# Patient Record
Sex: Female | Born: 1990 | Race: Black or African American | Hispanic: No | Marital: Single | Smoking: Never smoker
Health system: Southern US, Community
[De-identification: ages and names within clinical notes are randomized; demographics above are authoritative.]

## PROBLEM LIST (undated history)

## (undated) DIAGNOSIS — M940 Chondrocostal junction syndrome [Tietze]: Secondary | ICD-10-CM

## (undated) DIAGNOSIS — K589 Irritable bowel syndrome without diarrhea: Secondary | ICD-10-CM

## (undated) DIAGNOSIS — K219 Gastro-esophageal reflux disease without esophagitis: Secondary | ICD-10-CM

## (undated) HISTORY — PX: BREAST SURGERY: SHX581

## (undated) HISTORY — DX: Irritable bowel syndrome, unspecified: K58.9

## (undated) HISTORY — DX: Chondrocostal junction syndrome (tietze): M94.0

## (undated) HISTORY — DX: Gastro-esophageal reflux disease without esophagitis: K21.9

---

## 2017-03-01 ENCOUNTER — Other Ambulatory Visit: Payer: Self-pay

## 2017-03-01 ENCOUNTER — Encounter (INDEPENDENT_AMBULATORY_CARE_PROVIDER_SITE_OTHER): Payer: Self-pay | Admitting: Physician Assistant

## 2017-03-01 ENCOUNTER — Ambulatory Visit (INDEPENDENT_AMBULATORY_CARE_PROVIDER_SITE_OTHER): Payer: Self-pay | Admitting: Physician Assistant

## 2017-03-01 ENCOUNTER — Ambulatory Visit: Payer: Medicaid Other | Attending: Internal Medicine

## 2017-03-01 VITALS — BP 147/91 | HR 95 | Temp 99.3°F | Ht 67.5 in | Wt 154.0 lb

## 2017-03-01 DIAGNOSIS — R079 Chest pain, unspecified: Secondary | ICD-10-CM

## 2017-03-01 DIAGNOSIS — R002 Palpitations: Secondary | ICD-10-CM

## 2017-03-01 DIAGNOSIS — R110 Nausea: Secondary | ICD-10-CM

## 2017-03-01 MED ORDER — PROPRANOLOL HCL 20 MG PO TABS: 20.0000 mg | ORAL_TABLET | Freq: Two times a day (BID) | ORAL | 1 refills | Status: DC

## 2017-03-01 MED ORDER — ONDANSETRON HCL 4 MG PO TABS: 4.0000 mg | ORAL_TABLET | Freq: Three times a day (TID) | ORAL | 0 refills | Status: DC | PRN

## 2017-03-01 NOTE — Progress Notes (Signed)
Subjective:  Patient ID: Janice [name], female    DOB: [birth year]  Age: 25 y.o. MRN: [id number]  CC: palpitations   HPI Janice [name] is a 26 y.o. female with no significant PMH presents with chest wall pain from lower sternum to left and right upper chest since 3 days ago. Characterized as dull/annoying. Pain level 2/10.Palpitations are also present since 3 days ago. Palpitations occur moreso during the day time. Does not feel on edge or nervous. Has also felt restless lately but has not identified any emotional/stressful triggers. Considers herslef a "worrier".  Has not  been sexually active x 1 year and takes birth control. Denies any other symptoms.    ROS Review of Systems  Constitutional: Negative for chills, fever and malaise/fatigue.  Eyes: Negative for blurred vision.  Respiratory: Negative for shortness of breath.   Cardiovascular: Positive for chest pain and palpitations.  Gastrointestinal: Negative for abdominal pain and nausea.  Genitourinary: Negative for dysuria and hematuria.  Musculoskeletal: Negative for joint pain and myalgias.  Skin: Negative for rash.  Neurological: Negative for tingling and headaches.  Psychiatric/Behavioral: Negative for depression. The patient is not nervous/anxious.     Objective:  BP (!) 147/91 (BP Location: Right Arm, Patient Position: Sitting, Cuff Size: Normal)   Pulse 95   Temp 99.3 F (37.4 C) (Oral)   Ht 5' 7.5" (1.715 m)   Wt 154 lb (69.9 kg)   LMP 02/07/2017 (Approximate)   SpO2 100%   BMI 23.76 kg/m   BP/Weight 9/32/6712  Systolic BP 458  Diastolic BP 91  Wt. (Lbs) 154  BMI 23.76      Physical Exam  Constitutional: She is oriented to person, place, and time.  Well developed, well nourished, NAD, polite  HENT:  Head: Normocephalic and atraumatic.  Eyes: No scleral icterus.  Neck: Normal range of motion. Neck supple. No thyromegaly present.  Cardiovascular: Normal rate, regular rhythm and normal heart sounds.    Pulmonary/Chest: Effort normal and breath sounds normal.  Musculoskeletal: She exhibits no edema.  Neurological: She is alert and oriented to person, place, and time.  Skin: Skin is warm and dry. No rash noted. No erythema. No pallor.  Psychiatric: She has a normal mood and affect. Her behavior is normal. Thought content normal.  Vitals reviewed.    Assessment & Plan:   1. Palpitations - TSH - CBC with Differential/Platelet - Comprehensive metabolic panel - propranolol (INDERAL) 20 MG tablet; Take 1 tablet (20 mg total) by mouth 2 (two) times daily.  Dispense: 60 tablet; Refill: 1  2. Chest pain, unspecified type - EKG 12-Lead not working here in clinic. Had to send to [facility].   3. Nausea without vomiting - ondansetron (ZOFRAN) 4 MG tablet; Take 1 tablet (4 mg total) by mouth every 8 (eight) hours as needed for nausea or vomiting.  Dispense: 20 tablet; Refill: 0   Meds ordered this encounter  Medications  . ondansetron (ZOFRAN) 4 MG tablet    Sig: Take 1 tablet (4 mg total) by mouth every 8 (eight) hours as needed for nausea or vomiting.    Dispense:  20 tablet    Refill:  0    Order Specific Question:   Supervising Provider    Answer:   Tresa Garter [id number]  . propranolol (INDERAL) 20 MG tablet    Sig: Take 1 tablet (20 mg total) by mouth 2 (two) times daily.    Dispense:  60 tablet    Refill:  1  Order Specific Question:   Supervising Provider    Answer:   Tresa Garter [[id number]]    Follow-up: Return for palpitations.   Clent Demark PA

## 2017-03-01 NOTE — Patient Instructions (Signed)
Please go to [facility] to have your EKG done. I will call with results as soon as it is available. Take your medications as prescribed. Call an ambulance if your chest symptoms are persistent or worse.   Palpitations A palpitation is the feeling that your heartbeat is irregular or is faster than normal. It may feel like your heart is fluttering or skipping a beat. Palpitations are usually not a serious problem. They may be caused by many things, including smoking, caffeine, alcohol, stress, and certain medicines. Although most causes of palpitations are not serious, palpitations can be a sign of a serious medical problem. In some cases, you may need further medical evaluation. Follow these instructions at home: Pay attention to any changes in your symptoms. Take these actions to help with your condition:  Avoid the following:  Caffeinated coffee, tea, soft drinks, diet pills, and energy drinks.  Chocolate.  Alcohol.  Do not use any tobacco products, such as cigarettes, chewing tobacco, and e-cigarettes. If you need help quitting, ask your health care provider.  Try to reduce your stress and anxiety. Things that can help you relax include:  Yoga.  Meditation.  Physical activity, such as swimming, jogging, or walking.  Biofeedback. This is a method that helps you learn to use your mind to control things in your body, such as your heartbeats.  Get plenty of rest and sleep.  Take over-the-counter and prescription medicines only as told by your health care provider.  Keep all follow-up visits as told by your health care provider. This is important. Contact a health care provider if:  You continue to have a fast or irregular heartbeat after 24 hours.  Your palpitations occur more often. Get help right away if:  You have chest pain or shortness of breath.  You have a severe headache.  You feel dizzy or you faint. This information is not  intended to replace advice given to you by your health care provider. Make sure you discuss any questions you have with your health care provider. Document Released: 12/01/2000 Document Revised: 05/08/2016 Document Reviewed: 08/19/2015 Elsevier Interactive Patient Education  2017 [employer].

## 2017-03-02 LAB — COMPREHENSIVE METABOLIC PANEL
A/G RATIO: 1.5 (ref 1.2–2.2)
ALK PHOS: 58 IU/L (ref 39–117)
ALT: 17 IU/L (ref 0–32)
AST: 18 IU/L (ref 0–40)
Albumin: 4.7 g/dL (ref 3.5–5.5)
BUN/Creatinine Ratio: 11 (ref 9–23)
BUN: 10 mg/dL (ref 6–20)
Bilirubin Total: 0.4 mg/dL (ref 0.0–1.2)
CO2: 22 mmol/L (ref 18–29)
CREATININE: 0.92 mg/dL (ref 0.57–1.00)
Calcium: 9.5 mg/dL (ref 8.7–10.2)
Chloride: 98 mmol/L (ref 96–106)
GFR calc Af Amer: 99 mL/min/{1.73_m2} (ref >59)
GFR calc non Af Amer: 86 mL/min/{1.73_m2} (ref >59)
GLOBULIN, TOTAL: 3.2 g/dL (ref 1.5–4.5)
Glucose: 92 mg/dL (ref 65–99)
POTASSIUM: 4.1 mmol/L (ref 3.5–5.2)
SODIUM: 139 mmol/L (ref 134–144)
Total Protein: 7.9 g/dL (ref 6.0–8.5)

## 2017-03-02 LAB — CBC WITH DIFFERENTIAL/PLATELET
Basophils Absolute: 0 10*3/uL (ref 0.0–0.2)
Basos: 0 %
EOS (ABSOLUTE): 0 10*3/uL (ref 0.0–0.4)
EOS: 0 %
HEMATOCRIT: 42.3 % (ref 34.0–46.6)
Hemoglobin: 14.1 g/dL (ref 11.1–15.9)
Immature Grans (Abs): 0 10*3/uL (ref 0.0–0.1)
Immature Granulocytes: 0 %
LYMPHS ABS: 2.8 10*3/uL (ref 0.7–3.1)
Lymphs: 34 %
MCH: 30.3 pg (ref 26.6–33.0)
MCHC: 33.3 g/dL (ref 31.5–35.7)
MCV: 91 fL (ref 79–97)
MONOS ABS: 0.5 10*3/uL (ref 0.1–0.9)
Monocytes: 6 %
Neutrophils Absolute: 4.9 10*3/uL (ref 1.4–7.0)
Neutrophils: 60 %
Platelets: 203 10*3/uL (ref 150–379)
RBC: 4.65 x10E6/uL (ref 3.77–5.28)
RDW: 13.7 % (ref 12.3–15.4)
WBC: 8.3 10*3/uL (ref 3.4–10.8)

## 2017-03-02 LAB — TSH: TSH: 1.65 u[IU]/mL (ref 0.450–4.500)

## 2017-03-15 ENCOUNTER — Encounter (INDEPENDENT_AMBULATORY_CARE_PROVIDER_SITE_OTHER): Payer: Self-pay | Admitting: Physician Assistant

## 2017-03-15 ENCOUNTER — Ambulatory Visit (INDEPENDENT_AMBULATORY_CARE_PROVIDER_SITE_OTHER): Payer: Self-pay | Admitting: Physician Assistant

## 2017-03-15 VITALS — BP 130/90 | HR 79 | Temp 99.0°F | Ht 67.5 in | Wt 155.8 lb

## 2017-03-15 DIAGNOSIS — R002 Palpitations: Secondary | ICD-10-CM

## 2017-03-15 MED ORDER — PROPRANOLOL HCL 20 MG PO TABS: 20.0000 mg | ORAL_TABLET | Freq: Every day | ORAL | 1 refills | Status: DC

## 2017-03-15 NOTE — Patient Instructions (Signed)
Palpitations A palpitation is the feeling that your heartbeat is irregular or is faster than normal. It may feel like your heart is fluttering or skipping a beat. Palpitations are usually not a serious problem. They may be caused by many things, including smoking, caffeine, alcohol, stress, and certain medicines. Although most causes of palpitations are not serious, palpitations can be a sign of a serious medical problem. In some cases, you may need further medical evaluation. Follow these instructions at home: Pay attention to any changes in your symptoms. Take these actions to help with your condition:  Avoid the following: ? Caffeinated coffee, tea, soft drinks, diet pills, and energy drinks. ? Chocolate. ? Alcohol.  Do not use any tobacco products, such as cigarettes, chewing tobacco, and e-cigarettes. If you need help quitting, ask your health care provider.  Try to reduce your stress and anxiety. Things that can help you relax include: ? Yoga. ? Meditation. ? Physical activity, such as swimming, jogging, or walking. ? Biofeedback. This is a method that helps you learn to use your mind to control things in your body, such as your heartbeats.  Get plenty of rest and sleep.  Take over-the-counter and prescription medicines only as told by your health care provider.  Keep all follow-up visits as told by your health care provider. This is important.  Contact a health care provider if:  You continue to have a fast or irregular heartbeat after 24 hours.  Your palpitations occur more often. Get help right away if:  You have chest pain or shortness of breath.  You have a severe headache.  You feel dizzy or you faint. This information is not intended to replace advice given to you by your health care provider. Make sure you discuss any questions you have with your health care provider. Document Released: 12/01/2000 Document Revised: 05/08/2016 Document Reviewed: 08/19/2015 Elsevier  Interactive Patient Education  2017 [employer]  

## 2017-03-15 NOTE — Progress Notes (Signed)
  Subjective:  Patient ID: Janice [name], female    DOB: Dec 13, 1991  Age: 26 y.o. MRN: [id number]  CC: palpitations  HPI Janice [name] is a 26 y.o. female with no significant PMH presents for f/u of palpitations. Has been on Propranolol 20mg  BID since her last visit two weeks ago. Has not had any palpitations since then. Says that she is trying to ween herself off propranolol and is now taking it once per day with no palpitations present. She feels well and does not have any other complaints.     Review of Systems  Constitutional: Negative for chills, fever and malaise/fatigue.  Eyes: Negative for blurred vision.  Respiratory: Negative for shortness of breath.   Cardiovascular: Negative for chest pain and palpitations.  Gastrointestinal: Negative for abdominal pain and nausea.  Genitourinary: Negative for dysuria and hematuria.  Musculoskeletal: Negative for joint pain and myalgias.  Skin: Negative for rash.  Neurological: Negative for tingling and headaches.  Psychiatric/Behavioral: Negative for depression. The patient is not nervous/anxious.     Objective:  BP 130/90 (BP Location: Right Arm, Patient Position: Sitting, Cuff Size: Normal)   Pulse 79   Temp 99 F (37.2 C) (Oral)   Ht 5' 7.5" (1.715 m)   Wt 155 lb 12.8 oz (70.7 kg)   LMP 03/06/2017 (Exact Date)   SpO2 100%   BMI 24.04 kg/m   BP/Weight 03/15/2017 8/92/1194  Systolic BP 174 081  Diastolic BP 90 91  Wt. (Lbs) 155.8 154  BMI 24.04 23.76      Physical Exam  Constitutional: She is oriented to person, place, and time.  Well developed, well nourished, NAD, polite  HENT:  Head: Normocephalic and atraumatic.  Eyes: No scleral icterus.  Neck: Normal range of motion. No thyromegaly present.  Cardiovascular: Normal rate, regular rhythm and normal heart sounds.   Pulmonary/Chest: Effort normal and breath sounds normal.  Musculoskeletal: She exhibits no edema.  Neurological: She is alert and oriented to person, place, and  time. No cranial nerve deficit. Coordination normal.  Skin: Skin is warm and dry. No rash noted. She is not diaphoretic. No erythema. No pallor.  Psychiatric: She has a normal mood and affect. Her behavior is normal. Thought content normal.  Vitals reviewed.    Assessment & Plan:   1. Palpitations - Resolved with propranolol. Pt would like to ween off. I have advised patient to monitor symptoms and report to me if palpitations return. Advised to call ambulance if any severe symptoms should arise. - propranolol (INDERAL) 20 MG tablet; Take 1 tablet (20 mg total) by mouth daily.  Dispense: 60 tablet; Refill: 1   Meds ordered this encounter  Medications  . propranolol (INDERAL) 20 MG tablet    Sig: Take 1 tablet (20 mg total) by mouth daily.    Dispense:  60 tablet    Refill:  1    Order Specific Question:   Supervising Provider    Answer:   Tresa Garter [[id number]]    Follow-up: Return for annual physical.   Clent Demark PA

## 2017-04-05 ENCOUNTER — Telehealth (INDEPENDENT_AMBULATORY_CARE_PROVIDER_SITE_OTHER): Payer: Self-pay | Admitting: Physician Assistant

## 2017-04-05 NOTE — Telephone Encounter (Signed)
Patient called stated has question about her bill .  Informed patient to call patient billing :  Phone number on her bill. Patient agreed but still want to talk to PA regarding her follow up visit.  She stated she is off her mediation not having any issues. Stated does not need appt but if someone can call her back  [place]:   [phone]

## 2017-04-06 NOTE — Telephone Encounter (Signed)
Spoke with patient, she had questions about having her bill reduced, informed patient she would need to contact the billing department for all bill related inquires. Nat Christen, CMA

## 2020-01-20 ENCOUNTER — Emergency Department (HOSPITAL_COMMUNITY)
Admission: EM | Admit: 2020-01-20 | Discharge: 2020-01-20 | Disposition: A | Discharge status: Home / Self Care | Payer: Self-pay | Attending: Emergency Medicine | Admitting: Emergency Medicine

## 2020-01-20 ENCOUNTER — Other Ambulatory Visit: Payer: Self-pay

## 2020-01-20 ENCOUNTER — Encounter (HOSPITAL_COMMUNITY): Payer: Self-pay | Admitting: Emergency Medicine

## 2020-01-20 ENCOUNTER — Emergency Department (HOSPITAL_COMMUNITY): Payer: Self-pay

## 2020-01-20 DIAGNOSIS — R0789 Other chest pain: Secondary | ICD-10-CM | POA: Insufficient documentation

## 2020-01-20 DIAGNOSIS — Z79899 Other long term (current) drug therapy: Secondary | ICD-10-CM | POA: Insufficient documentation

## 2020-01-20 LAB — POC URINE PREG, ED: Preg Test, Ur: NEGATIVE

## 2020-01-20 IMAGING — CR DG RIBS W/ CHEST 3+V*R*
5 series · 5 of 5 positions shown · non-contrast
Comparison: None.

CLINICAL DATA: Right-sided rib pain for 2 weeks.

EXAM:
RIGHT RIBS AND CHEST - 3+ VIEW

[chest pa]
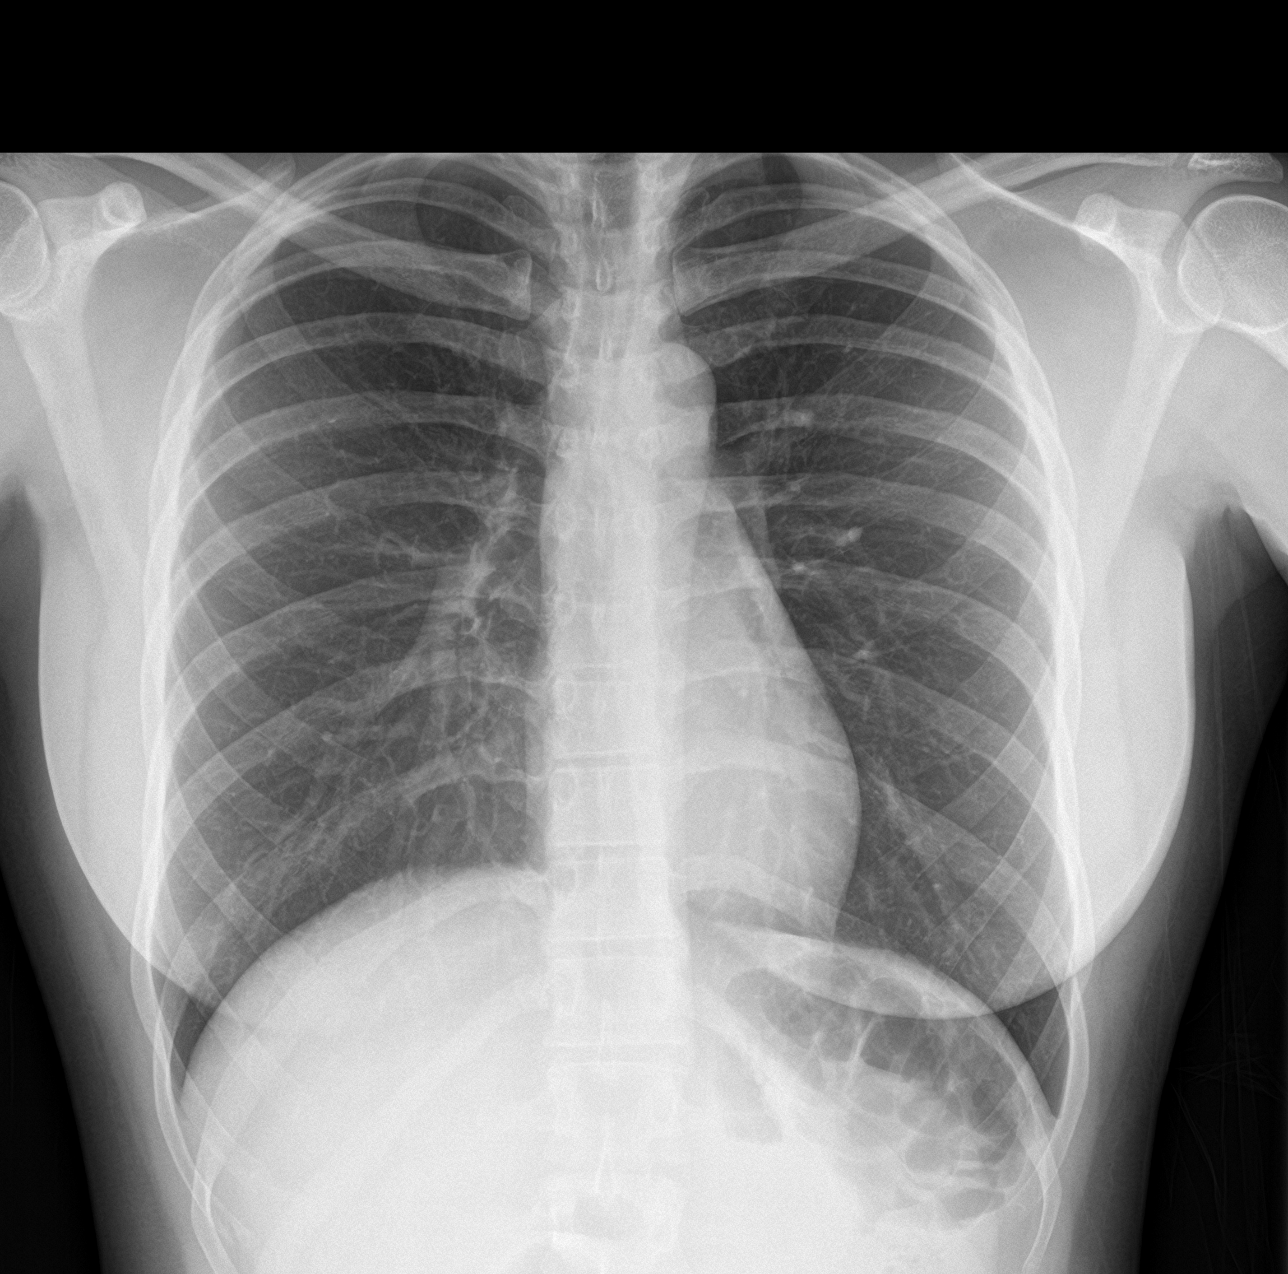

[rib pa obl (1 of 2)]
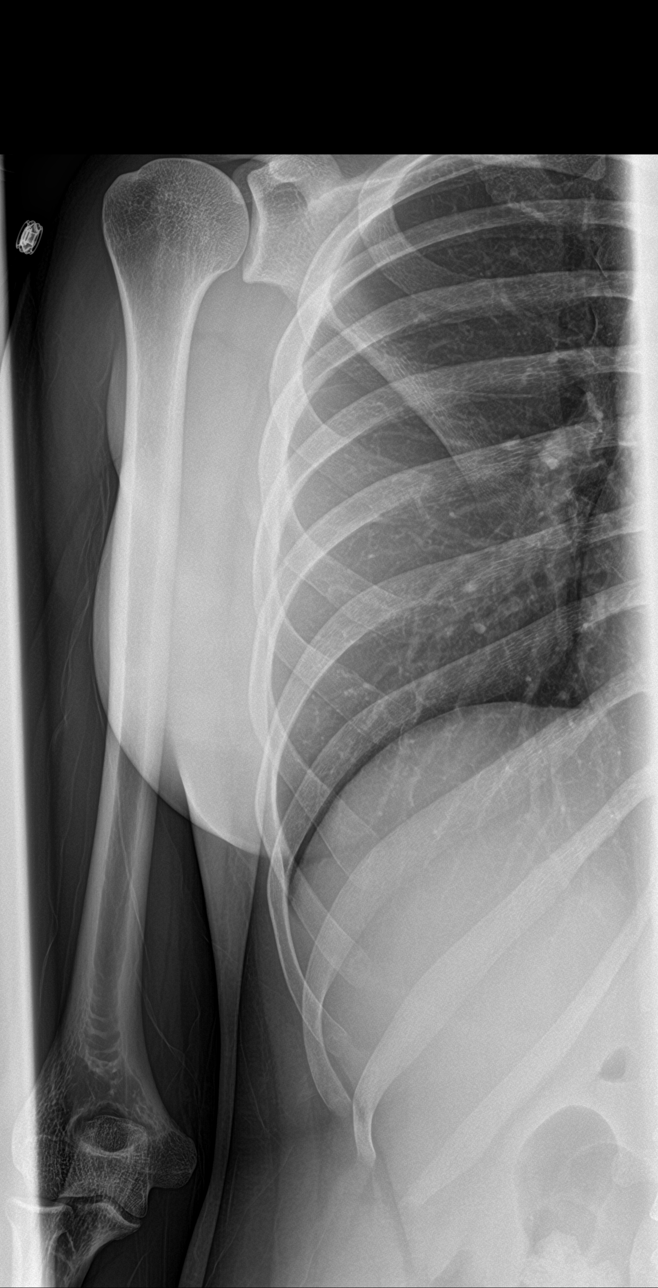

[rib pa obl (2 of 2)]
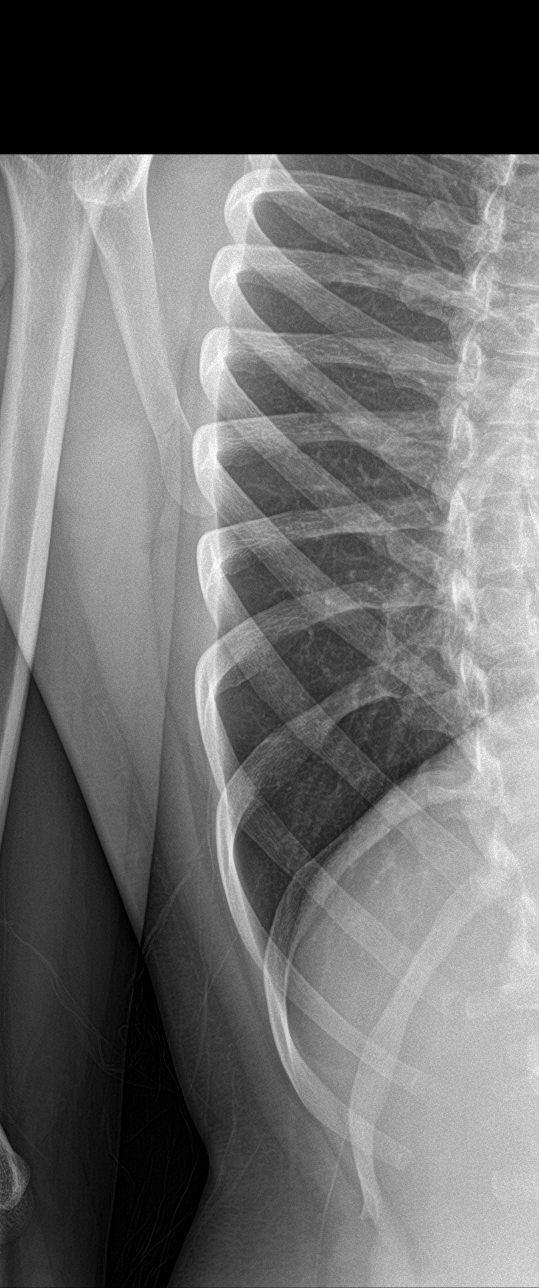

[chest ap]
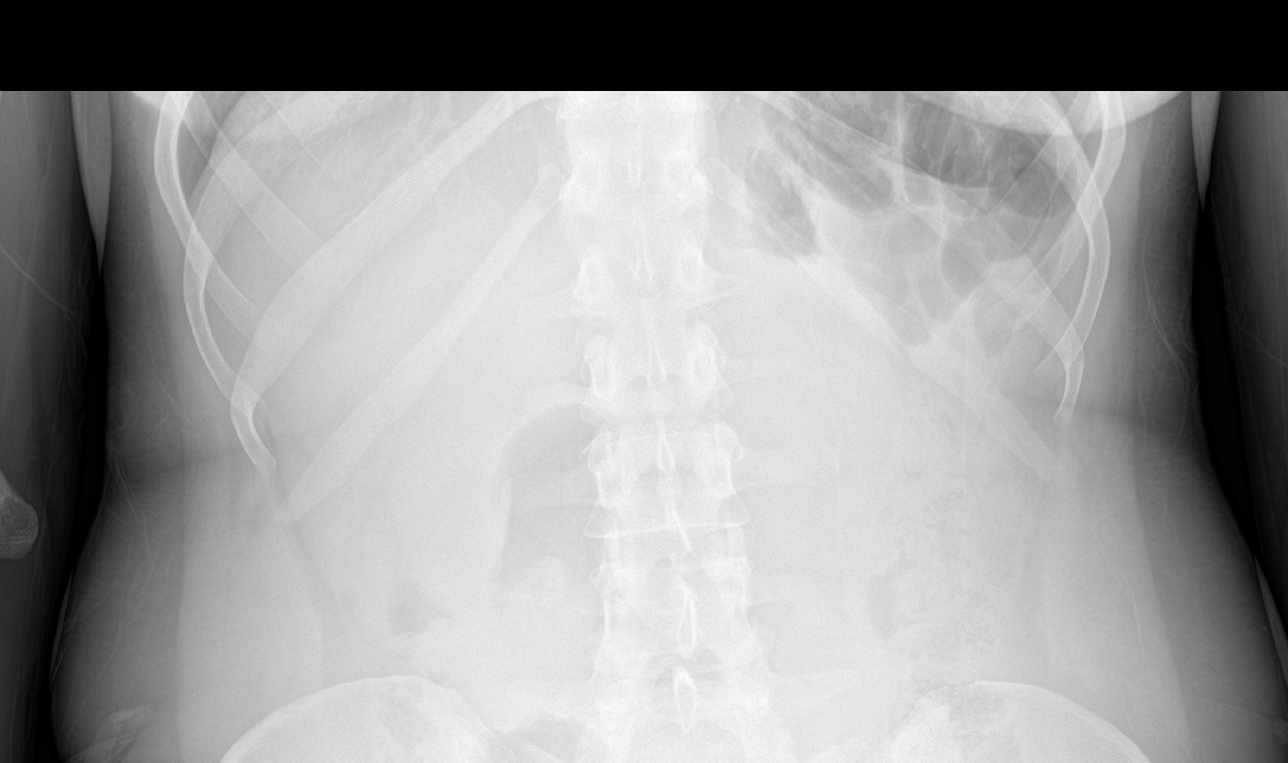

[rib ap]
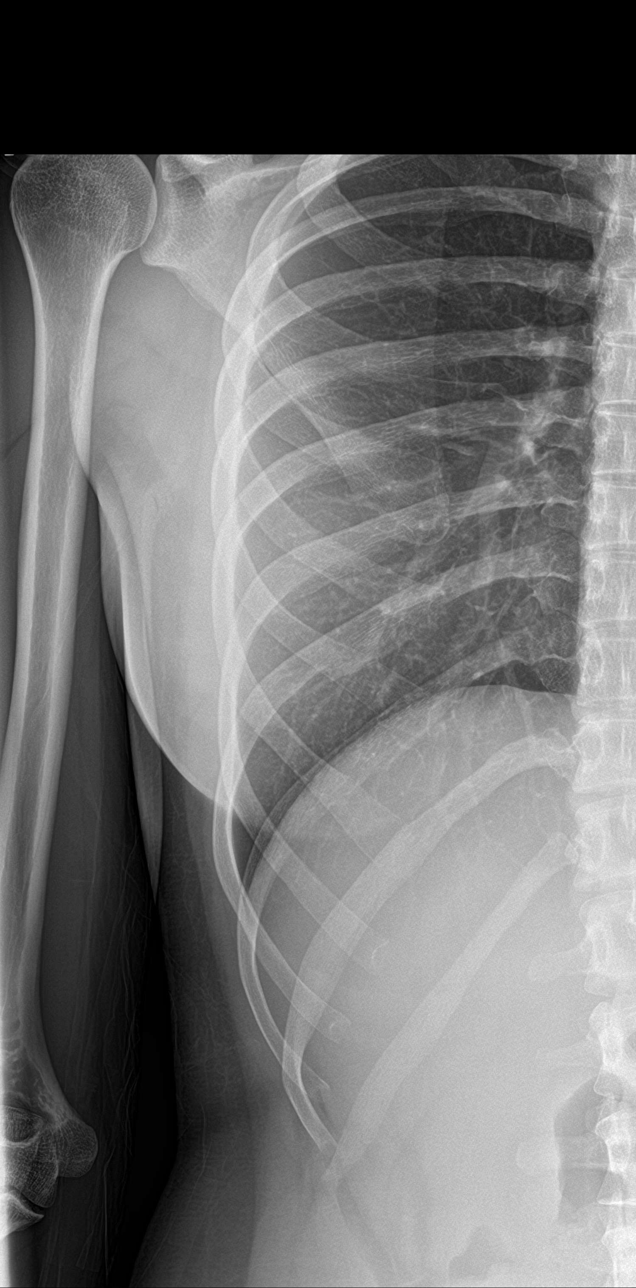

[5 of 5 positions shown; findings below may reference images not displayed]

FINDINGS: No fracture or other bone lesions are seen involving the ribs. There
is no evidence of pneumothorax or pleural effusion. Both lungs are
clear. Heart size and mediastinal contours are within normal limits.
IMPRESSION: Negative.

## 2020-01-20 MED ORDER — METHOCARBAMOL 500 MG PO TABS: 500.0000 mg | ORAL_TABLET | Freq: Two times a day (BID) | ORAL | 0 refills | Status: DC

## 2020-01-20 MED ORDER — DICLOFENAC SODIUM 1 % EX GEL
4.0000 g | Freq: Once | CUTANEOUS | Status: AC
  Administered 2020-01-20: 23:00:00 4 g via TOPICAL
  Filled 2020-01-20: qty 100

## 2020-01-20 MED ORDER — DICLOFENAC SODIUM 1 % EX GEL: 4.0000 g | Freq: Four times a day (QID) | CUTANEOUS | 2 refills | Status: DC

## 2020-01-20 MED ORDER — LIDOCAINE 5 % EX PTCH: 1.0000 | MEDICATED_PATCH | CUTANEOUS | 0 refills | Status: DC

## 2020-01-20 NOTE — ED Provider Notes (Signed)
[facility] Provider Note   CSN: [id number] Arrival date & time: 01/20/20  [id number]     History Chief Complaint  Patient presents with  . Breast Pain    Janice [name] is a 29 y.o. female.  HPI      Janice [name] is a 30 y.o. female, patient with no pertinent past medical history, presenting to the ED with right-sided chest tenderness beginning 2 weeks ago.  She states the pain arose gradually following an intense workout that included upper body weight lifting and full body exercises, such as jumping jacks.  Pain is described as a soreness, worse with movement or palpation, mild to moderate, nonradiating.  She has intermittently tried naproxen with temporary improvement.  Denies fever/chills, swelling, deformity, falls/trauma, breast swelling, nipple discharge, shortness of breath, cough, abdominal pain, N/V/D, dizziness, diaphoresis, or any other complaints.   History reviewed. No pertinent past medical history.  There are no problems to display for this patient.   History reviewed. No pertinent surgical history.   OB History   No obstetric history on file.     No family history on file.  Social History   Tobacco Use  . Smoking status: Never Smoker  . Smokeless tobacco: Never Used  Substance Use Topics  . Alcohol use: Never  . Drug use: Never    Home Medications Prior to Admission medications   Medication Sig Start Date End Date Taking? Authorizing Provider  cetirizine (ZYRTEC) 10 MG tablet Take 10 mg by mouth daily as needed (for seasonal allergies).    Yes [provider]  fluticasone (FLONASE) 50 MCG/ACT nasal spray Place 2 sprays into both nostrils daily as needed (for seasonal allergies).    Yes [provider]  Multiple Vitamins-Minerals (ONE-A-DAY WOMENS PO) Take 1 tablet by mouth daily with breakfast.    Yes [provider]  naproxen (NAPROSYN) 500 MG tablet Take 500 mg by mouth 2 (two) times daily  as needed for pain. 09/24/19  Yes [provider]  diclofenac Sodium (VOLTAREN) 1 % GEL Apply 4 g topically 4 (four) times daily. 01/20/20   Maddelynn Moosman C, PA-C  lidocaine (LIDODERM) 5 % Place 1 patch onto the skin daily. Remove & Discard patch within 12 hours or as directed by MD 01/20/20   Arlean Hopping C, PA-C  methocarbamol (ROBAXIN) 500 MG tablet Take 1 tablet (500 mg total) by mouth 2 (two) times daily. 01/20/20   Ulysses Alper, Helane Gunther, PA-C    Allergies    Patient has no known allergies.  Review of Systems   Review of Systems  Constitutional: Negative for chills, diaphoresis and fever.  Respiratory: Negative for cough and shortness of breath.   Cardiovascular: Negative for leg swelling.  Gastrointestinal: Negative for abdominal pain, diarrhea, nausea and vomiting.  Musculoskeletal: Negative for back pain.       Chest tenderness  Neurological: Negative for dizziness, syncope, weakness and light-headedness.  All other systems reviewed and are negative.   Physical Exam Updated Vital Signs BP 130/88 (BP Location: Right Arm)   Pulse 73   Temp 98 F (36.7 C) (Oral)   Resp 18   Ht 5\' 8"  (1.727 m)   Wt 72.6 kg   SpO2 100%   BMI 24.33 kg/m   Physical Exam Vitals and nursing note reviewed. Exam conducted with a chaperone present.  Constitutional:      General: She is not in acute distress.    Appearance: She is well-developed. She is not  diaphoretic.  HENT:     Head: Normocephalic and atraumatic.     Mouth/Throat:     Mouth: Mucous membranes are moist.     Pharynx: Oropharynx is clear.  Eyes:     Conjunctiva/sclera: Conjunctivae normal.  Cardiovascular:     Rate and Rhythm: Normal rate and regular rhythm.     Pulses: Normal pulses.          Radial pulses are 2+ on the right side and 2+ on the left side.       Posterior tibial pulses are 2+ on the right side and 2+ on the left side.     Heart sounds: Normal heart sounds.     Comments: Tactile temperature in the extremities  appropriate and equal bilaterally. Pulmonary:     Effort: Pulmonary effort is normal. No respiratory distress.     Breath sounds: Normal breath sounds.  Chest:     Chest wall: Tenderness present. No deformity, swelling, crepitus or edema.     Breasts:        Right: No swelling, nipple discharge, skin change or tenderness.        Comments: Tenderness to the right chest wall, as shown.  No deformity, swelling, instability, color change.  Some of the tenderness crosses into the edge of the breast tissue, however, no other breast tenderness. No tenderness into the axilla. [facility] [place], Shirlean Mylar, served as Producer, television/film/video. Abdominal:     Palpations: Abdomen is soft.     Tenderness: There is no abdominal tenderness. There is no guarding.  Musculoskeletal:     Cervical back: Neck supple.     Right lower leg: No edema.     Left lower leg: No edema.     Comments: Full range of motion in the bilateral shoulders without noted pain or difficulty.  Lymphadenopathy:     Cervical: No cervical adenopathy.     Upper Body:     Right upper body: No supraclavicular, axillary, pectoral or epitrochlear adenopathy.  Skin:    General: Skin is warm and dry.  Neurological:     Mental Status: She is alert.  Psychiatric:        Mood and Affect: Mood and affect normal.        Speech: Speech normal.        Behavior: Behavior normal.     ED Results / Procedures / Treatments   Labs (all labs ordered are listed, but only abnormal results are displayed) Labs Reviewed  POC URINE PREG, ED    EKG None  Radiology DG Ribs Unilateral W/Chest Right  Result Date: 01/20/2020 CLINICAL DATA:  Right-sided rib pain for 2 weeks. EXAM: RIGHT RIBS AND CHEST - 3+ VIEW COMPARISON:  None. FINDINGS: No fracture or other bone lesions are seen involving the ribs. There is no evidence of pneumothorax or pleural effusion. Both lungs are clear. Heart size and mediastinal contours are within normal limits. IMPRESSION: Negative.  Electronically Signed   By: Constance Holster M.D.   On: 01/20/2020 21:51    Procedures Procedures (including critical care time)  Medications Ordered in ED Medications  diclofenac Sodium (VOLTAREN) 1 % topical gel 4 g (4 g Topical Given 01/20/20 2319)    ED Course  I have reviewed the triage vital signs and the nursing notes.  Pertinent labs & imaging results that were available during my care of the patient were reviewed by me and considered in my medical decision making (see chart for details).    MDM Rules/Calculators/A&P  Patient presents with tenderness to the right chest wall.  Patient is nontoxic appearing, afebrile, not tachycardic, not tachypneic, not hypotensive, maintains excellent SPO2 on room air, and is in no apparent distress.  No additional abnormalities noted on exam.  X-ray without acute abnormality. PERC negative.  PCP follow-up for any further management, as needed. The patient was given instructions for home care as well as return precautions. Patient voices understanding of these instructions, accepts the plan, and is comfortable with discharge.  Vitals:   01/20/20 1931 01/20/20 2304  BP: 130/88 107/76  Pulse: 73 63  Resp: 18 16  Temp: 98 F (36.7 C)   TempSrc: Oral   SpO2: 100% 100%  Weight: 72.6 kg   Height: 5\' 8"  (1.727 m)      Final Clinical Impression(s) / ED Diagnoses Final diagnoses:  Chest wall tenderness    Rx / DC Orders ED Discharge Orders         Ordered    lidocaine (LIDODERM) 5 %  Every 24 hours,   Status:  Discontinued     01/20/20 2207    diclofenac Sodium (VOLTAREN) 1 % GEL  4 times daily,   Status:  Discontinued     01/20/20 2207    methocarbamol (ROBAXIN) 500 MG tablet  2 times daily,   Status:  Discontinued     01/20/20 2207    diclofenac Sodium (VOLTAREN) 1 % GEL  4 times daily     01/20/20 2210    lidocaine (LIDODERM) 5 %  Every 24 hours     01/20/20 2210    methocarbamol (ROBAXIN) 500 MG tablet  2  times daily     01/20/20 2210           Lorayne Bender, PA-C 01/21/20 1407    Veryl Speak, MD 01/21/20 1623

## 2020-01-20 NOTE — ED Notes (Signed)
Patient transported to X-ray 

## 2020-01-20 NOTE — Discharge Instructions (Addendum)
There were no acute abnormalities noted on the x-ray, including no sign of fractures, although there can be fractures that do not show up on the x-ray, called occult fractures.  Please adhere to the following instructions: Antiinflammatory medications: Take 600 mg of ibuprofen every 6 hours or 440 mg (over the counter dose) to 500 mg (prescription dose) of naproxen every 12 hours for the next 3 days. After this time, these medications may be used as needed for pain. Take these medications with food to avoid upset stomach. Choose only one of these medications, do not take them together. Tylenol: Should you continue to have additional pain while taking the ibuprofen or naproxen, you may add in tylenol as needed. Your daily total maximum amount of tylenol from all sources should be limited to 4000mg /day for persons without liver problems, or 2000mg /day for those with liver problems.  Diclofenac gel: This is a topical anti-inflammatory medication and can be applied directly to the painful region.  Do not use on the face or genitals.  This medication may be used as an alternative to oral anti-inflammatory medications, such as ibuprofen or naproxen.  Lidocaine patches: These are available via either prescription or over-the-counter. The over-the-counter option may be more economical one and are likely just as effective. There are multiple over-the-counter brands, such as Salonpas.  Other pain management: Many parts of pain management involve experimentation to find what works for you as an individual patient.  You may try hot/cold packs.  You may also need to change your sleeping position.  This may involve sleeping propped up in a chair or with extra pillows.  Duration of pain: For bruising or contusions to the ribs, pain can last 4-6 weeks.    Follow-up: Please follow-up with a primary care provider for any further management of this issue.  Any further pain management should also be handled by a primary  care provider.  Return: Return to the ED should you begin to have significantly worsening pain, onset of shortness of breath (not just hesitancy to take a deep breath due to pain), fever over 100.3 F accompanied by cough, coughing up blood, or any other major concerns.  For prescription assistance, may try using prescription discount sites or apps, such as goodrx.com

## 2020-01-20 NOTE — ED Triage Notes (Signed)
Pt c/o right breast pain for the past 2 weeks getting worse tonight, pain started after she was working out.

## 2020-03-23 ENCOUNTER — Other Ambulatory Visit: Payer: Self-pay | Admitting: Primary Care

## 2020-03-23 DIAGNOSIS — N63 Unspecified lump in unspecified breast: Secondary | ICD-10-CM

## 2020-03-23 DIAGNOSIS — N644 Mastodynia: Secondary | ICD-10-CM

## 2020-03-24 ENCOUNTER — Other Ambulatory Visit: Payer: Self-pay

## 2020-03-24 DIAGNOSIS — N644 Mastodynia: Secondary | ICD-10-CM

## 2020-03-30 ENCOUNTER — Ambulatory Visit: Payer: Self-pay | Admitting: Medical

## 2020-03-30 ENCOUNTER — Encounter: Payer: Self-pay | Admitting: Medical

## 2020-03-30 ENCOUNTER — Other Ambulatory Visit: Payer: Self-pay

## 2020-03-30 VITALS — BP 128/82 | Temp 98.4°F | Wt 162.0 lb

## 2020-03-30 DIAGNOSIS — N644 Mastodynia: Secondary | ICD-10-CM

## 2020-03-30 NOTE — Progress Notes (Signed)
Ms. Tieka Mantovani is a 29 y.o. female who presents to [facility] clinic today with complaint of right breast pain. Focal area of pain noted in the right outer breast. She has a history of cyst drainage at age 35 years.     Pap Smear: Pap not smear completed today. Last Pap smear was 10/2019 at [facility] clinic in [place] and was normal. Per patient has no history of an abnormal Pap smear. Last Pap smear result is not available in Epic.   Physical exam: Breasts Breasts symmetrical. No skin abnormalities bilateral breasts. No nipple retraction bilateral breasts. No nipple discharge bilateral breasts. No lymphadenopathy. No lumps palpated bilateral breasts.       Pelvic/Bimanual Pap is not indicated today    Smoking History: Patient has never smoked.     Patient Navigation: Patient education provided. Access to services provided for patient through [facility] program.    Colorectal Cancer Screening: Per patient has never had colonoscopy completed No complaints today.    Breast and Cervical Cancer Risk Assessment: Patient does not have family history of breast cancer, known genetic mutations, or radiation treatment to the chest before age 62. Patient does not have history of cervical dysplasia, immunocompromised, or DES exposure in-utero.  Risk Assessment    Risk Scores      03/30/2020   Last edited by: Demetrius Revel, LPN   5-year risk:    Lifetime risk:           A: [facility] exam without pap smear Complaint of right breast pain x 6 months  P: Referred patient to the [facility] for a diagnostic mammogram. Appointment scheduled 04/09/20 @ 9:30 am.  Luvenia Redden, PA-C 03/30/2020 9:59 AM

## 2020-03-30 NOTE — Patient Instructions (Signed)
Mammogram °A mammogram is an X-ray of the breasts that is done to check for changes that are not normal. This test can screen for and find any changes that may suggest breast cancer. Mammograms are regularly done on women. A man may have a mammogram if he has a lump or swelling in his breast. This test can also help to find other changes and variations in the breast. °Tell a doctor: °· About any allergies you have. °· If you have breast implants. °· If you have had previous breast disease, biopsy, or surgery. °· If you are breastfeeding. °· If you are younger than age 25. °· If you have a family history of breast cancer. °· Whether you are pregnant or may be pregnant. °What are the risks? °Generally, this is a safe procedure. However, problems may occur, including: °· Exposure to radiation. Radiation levels are very low with this test. °· The results being misinterpreted. °· The need for further tests. °· The inability of the mammogram to detect certain cancers. °What happens before the procedure? °· Have this test done about 1-2 weeks after your period. This is usually when your breasts are the least tender. °· If you are visiting a new doctor or clinic, send any past mammogram images to your new doctor's office. °· Wash your breasts and under your arms the day of the test. °· Do not use deodorants, perfumes, lotions, or powders on the day of the test. °· Take off any jewelry from your neck. °· Wear clothes that you can change into and out of easily. °What happens during the procedure? ° °· You will undress from the waist up. You will put on a gown. °· You will stand in front of the X-ray machine. °· Each breast will be placed between two plastic or glass plates. The plates will press down on your breast for a few seconds. Try to stay as relaxed as possible. This does not cause any harm to your breasts. Any discomfort you feel will be very brief. °· X-rays will be taken from different angles of each breast. °The  procedure may vary among doctors and hospitals. °What happens after the procedure? °· The mammogram will be read by a specialist (radiologist). °· You may need to do certain parts of the test again. This depends on the quality of the images. °· Ask when your test results will be ready. Make sure you get your test results. °· You may go back to your normal activities. °Summary °· A mammogram is a low energy X-ray of the breasts that is done to check for abnormal changes. A man may have this test if he has a lump or swelling in his breast. °· Before the procedure, tell your doctor about any breast problems that you have had in the past. °· Have this test done about 1-2 weeks after your period. °· For the test, each breast will be placed between two plastic or glass plates. The plates will press down on your breast for a few seconds. °· The mammogram will be read by a specialist (radiologist). Ask when your test results will be ready. Make sure you get your test results. °This information is not intended to replace advice given to you by your health care provider. Make sure you discuss any questions you have with your health care provider. °Document Revised: 07/25/2018 Document Reviewed: 07/25/2018 °Elsevier Patient Education © 2020 [employer] ° °

## 2020-04-09 ENCOUNTER — Other Ambulatory Visit: Payer: Medicaid Other

## 2020-04-09 ENCOUNTER — Ambulatory Visit
Admission: RE | Admit: 2020-04-09 | Discharge: 2020-04-09 | Disposition: A | Discharge status: Home / Self Care | Payer: No Typology Code available for payment source | Source: Ambulatory Visit | Attending: Obstetrics and Gynecology | Admitting: Obstetrics and Gynecology

## 2020-04-09 ENCOUNTER — Other Ambulatory Visit: Payer: Self-pay

## 2020-04-09 ENCOUNTER — Other Ambulatory Visit: Payer: Self-pay | Admitting: Obstetrics and Gynecology

## 2020-04-09 DIAGNOSIS — N644 Mastodynia: Secondary | ICD-10-CM

## 2020-04-09 IMAGING — MG DIGITAL DIAGNOSTIC BILAT W/ TOMO W/ CAD
8 series · 8 of 24 positions shown · non-contrast
Comparison: None.

CLINICAL DATA: Patient presents for diffuse right breast pain and
palpable abnormalities within the right breast.

EXAM:
DIGITAL DIAGNOSTIC BILATERAL MAMMOGRAM WITH CAD AND TOMO
ULTRASOUND RIGHT BREAST

[R CC synth-2D]
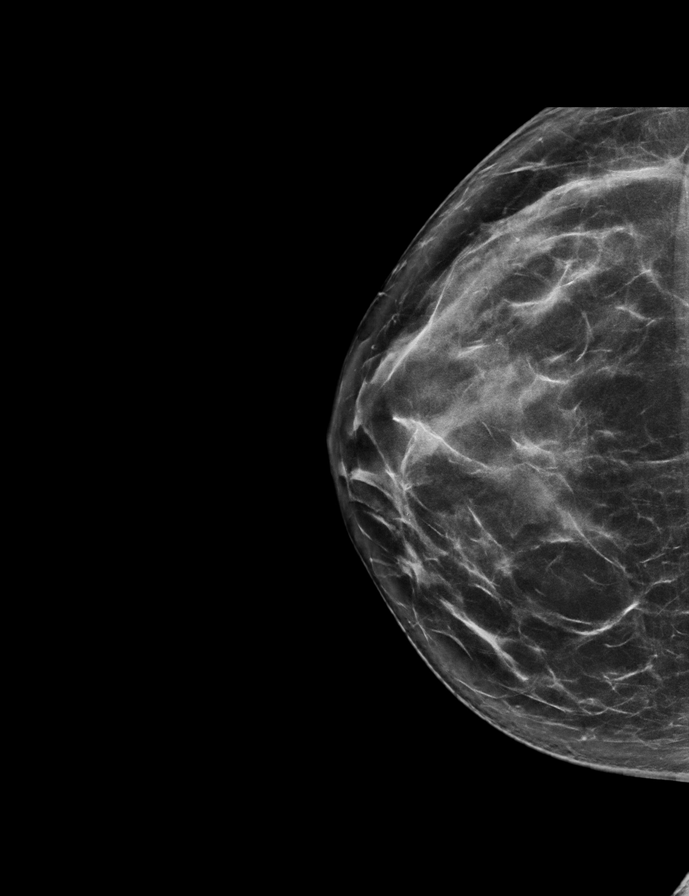

[L MLO synth-2D]
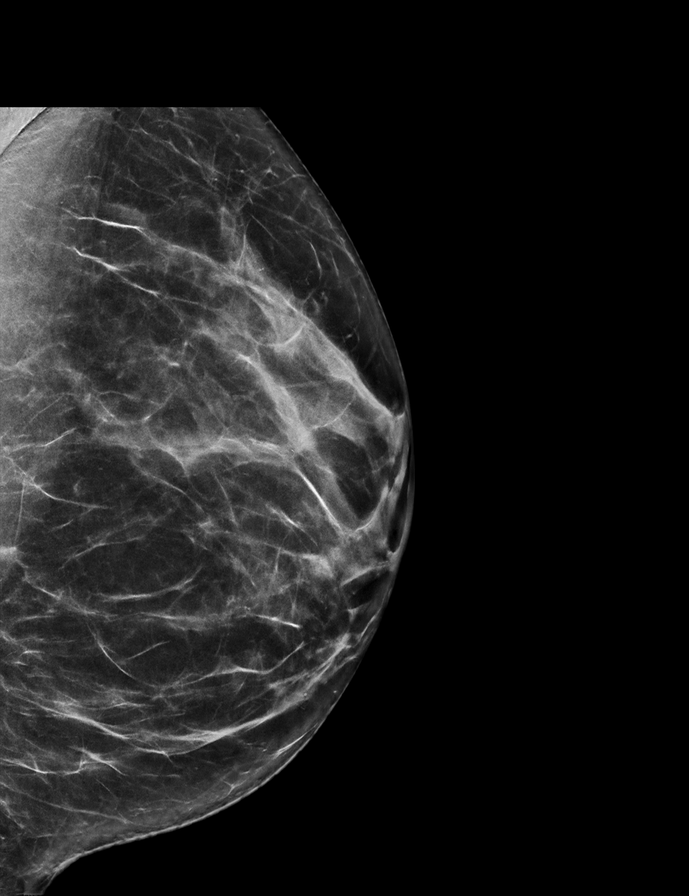

[R MLO synth-2D]
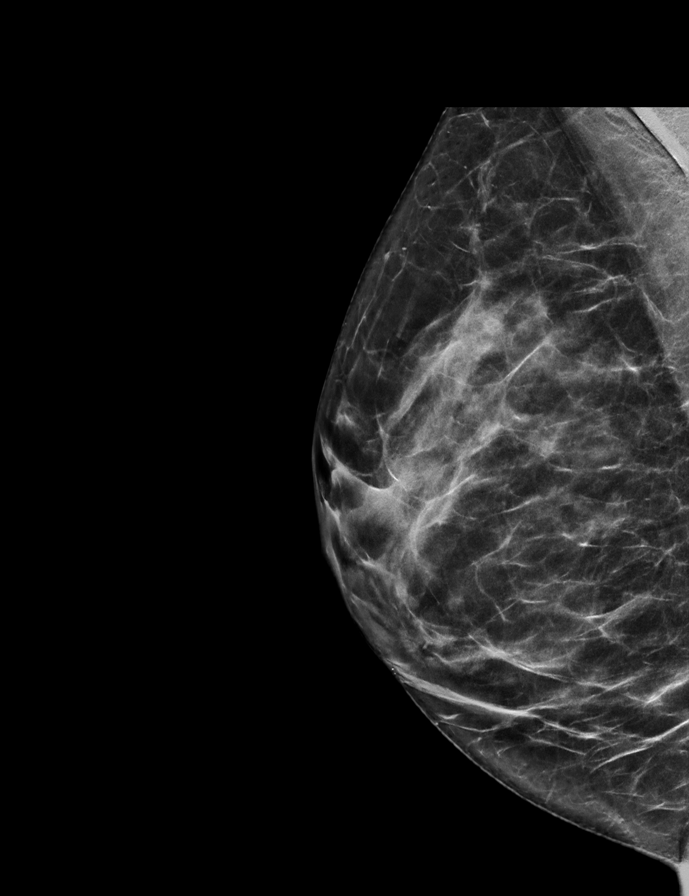

[L CC synth-2D]
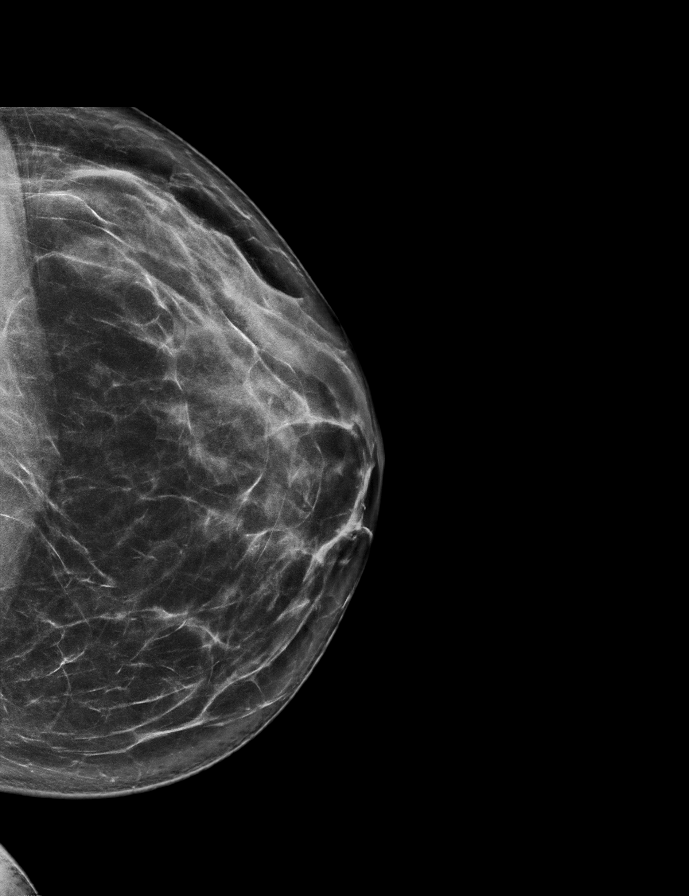

[L CC tomo · tomo slice 39/76.0]
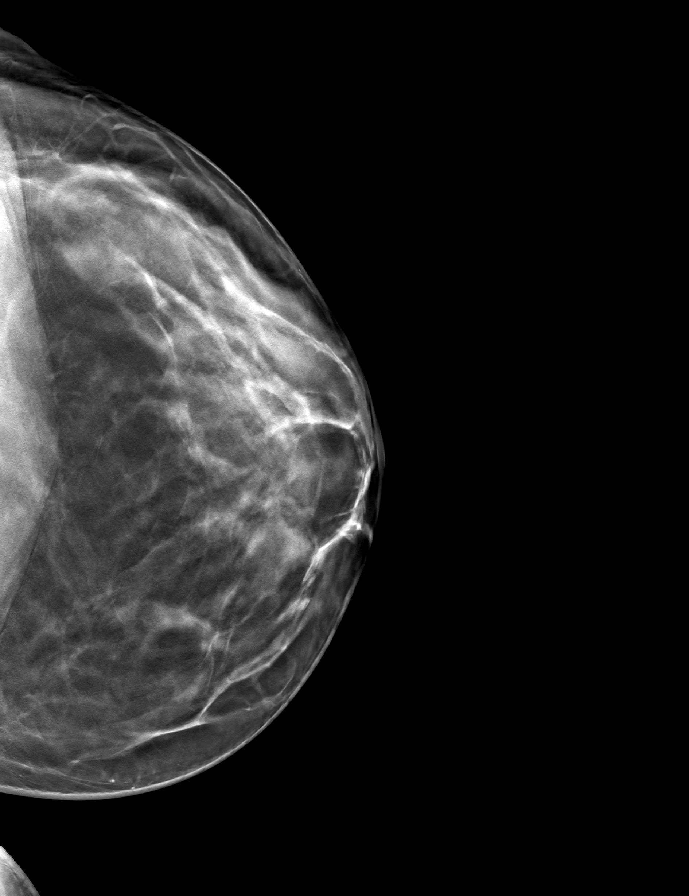

[L MLO tomo · tomo slice 33/66.0]
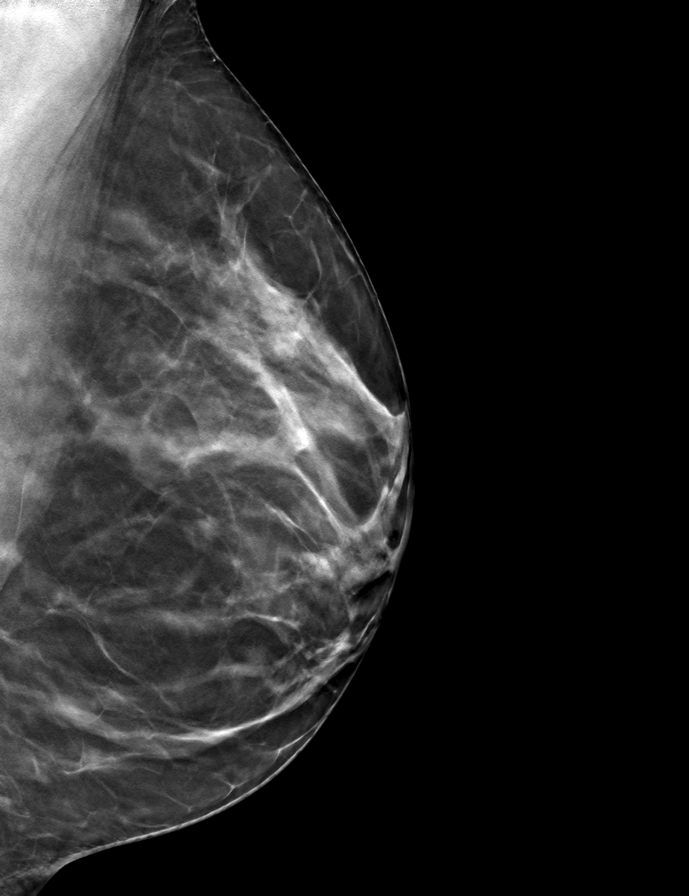

[R CC tomo · tomo slice 39/76.0]
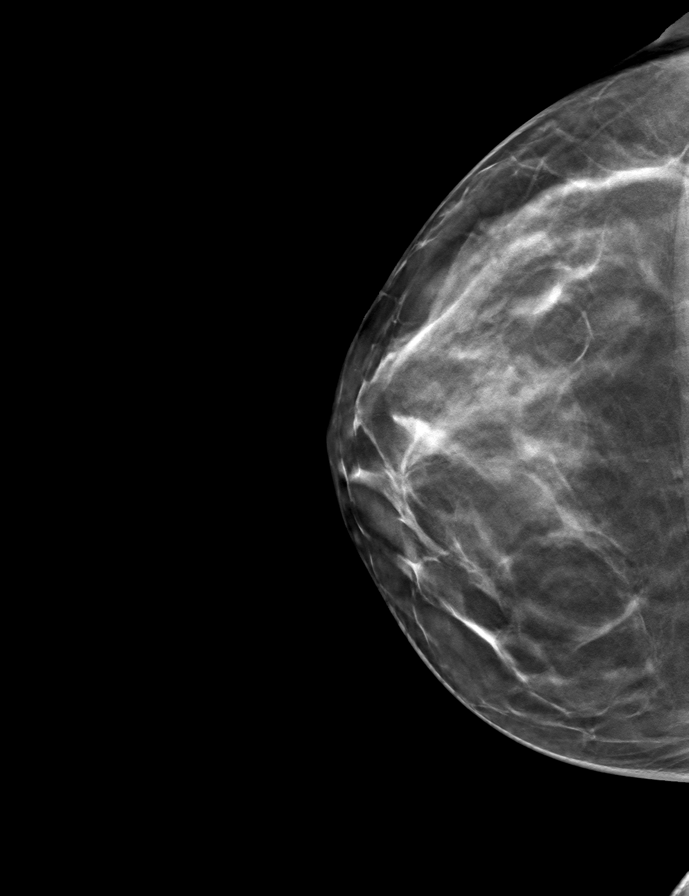

[R MLO tomo · tomo slice 35/70.0]
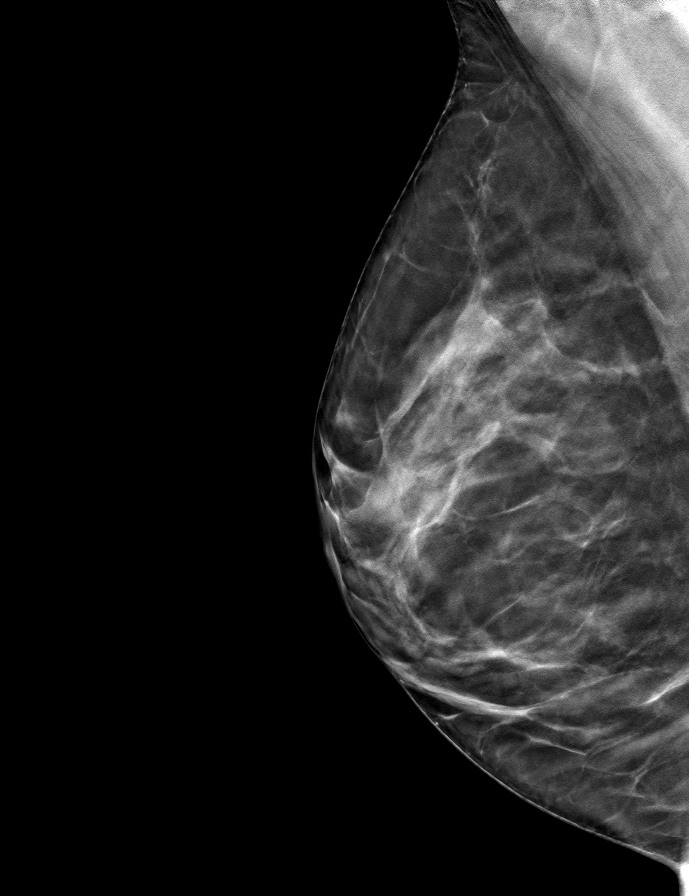

[8 of 24 positions shown; findings below may reference images not displayed]

ACR Breast Density Category c: The breast tissue is heterogeneously
dense, which may obscure small masses.
FINDINGS: No concerning masses, calcifications or distortion identified within
the right or left breast.

Mammographic images were processed with CAD.

On physical exam, dense tissue is palpated within the upper-outer
right and lower outer right breast.

Targeted ultrasound is performed, showing dense tissue within the
right breast 8 o'clock position 7 cm from nipple at the site of
palpable concern. Additionally, 4 quadrant ultrasound demonstrates
normal dense tissue without suspicious mass.
IMPRESSION: No mammographic or sonographic evidence for malignancy.

Palpable abnormality corresponds with dense tissue.

RECOMMENDATION:
Continued clinical evaluation for palpable area of concern within
the right breast.

Screening mammogram at age 39 unless there are persistent or
intervening clinical concerns. (Code:[YM])

I have discussed the findings and recommendations with the patient.
If applicable, a reminder letter will be sent to the patient
regarding the next appointment.

BI-RADS CATEGORY  1: Negative.

## 2020-04-09 IMAGING — US US BREAST*R* LIMITED INC AXILLA
1 series · 7 of 7 positions shown · non-contrast
Comparison: None.

CLINICAL DATA: Patient presents for diffuse right breast pain and
palpable abnormalities within the right breast.

EXAM:
DIGITAL DIAGNOSTIC BILATERAL MAMMOGRAM WITH CAD AND TOMO
ULTRASOUND RIGHT BREAST

[Series 1: us breast*right* limited inc axilla · 0.07mm/px · 7 of 7 slices shown]
[im 1/7]
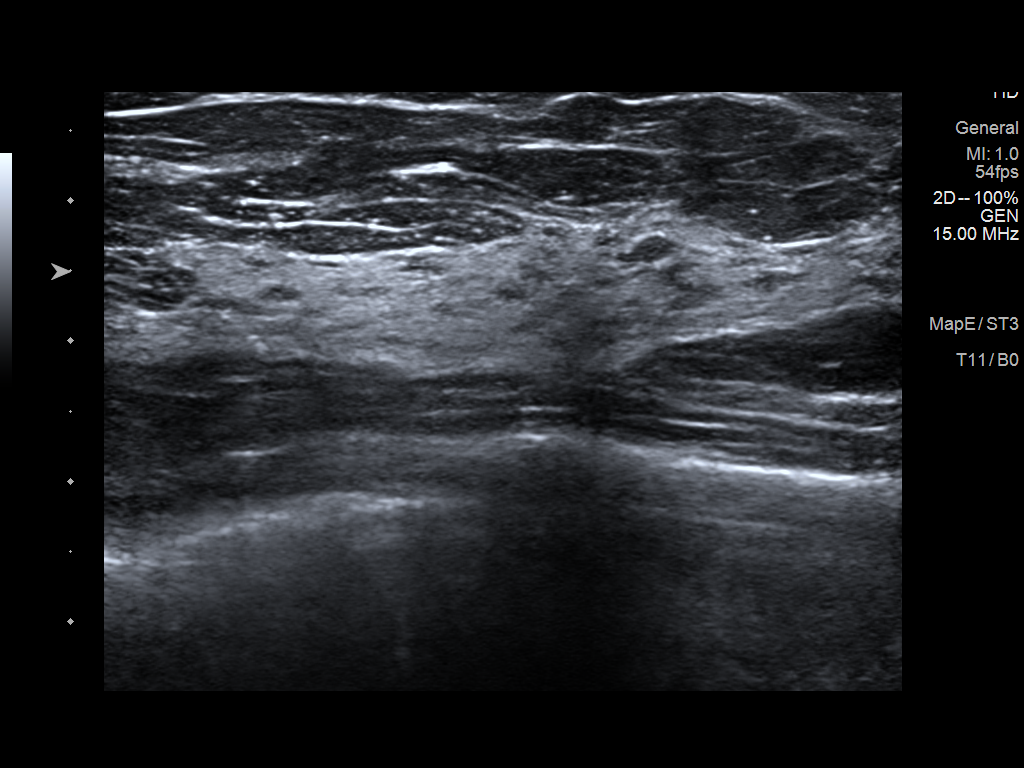
[im 2/7]
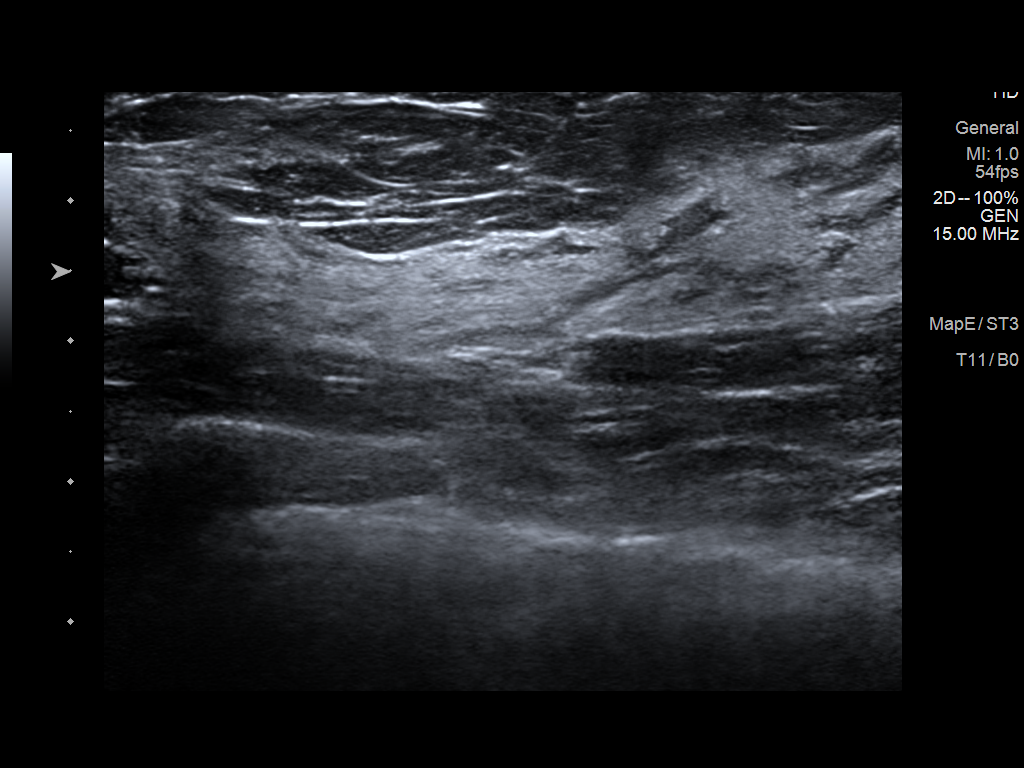
[im 3/7]
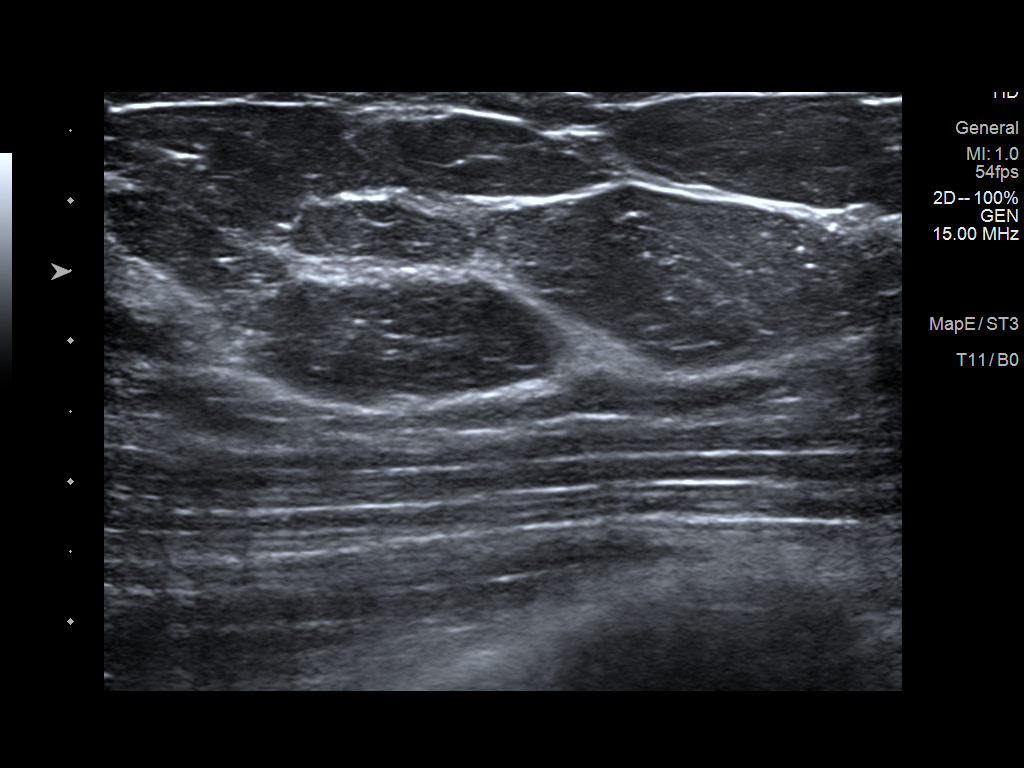
[im 4/7]
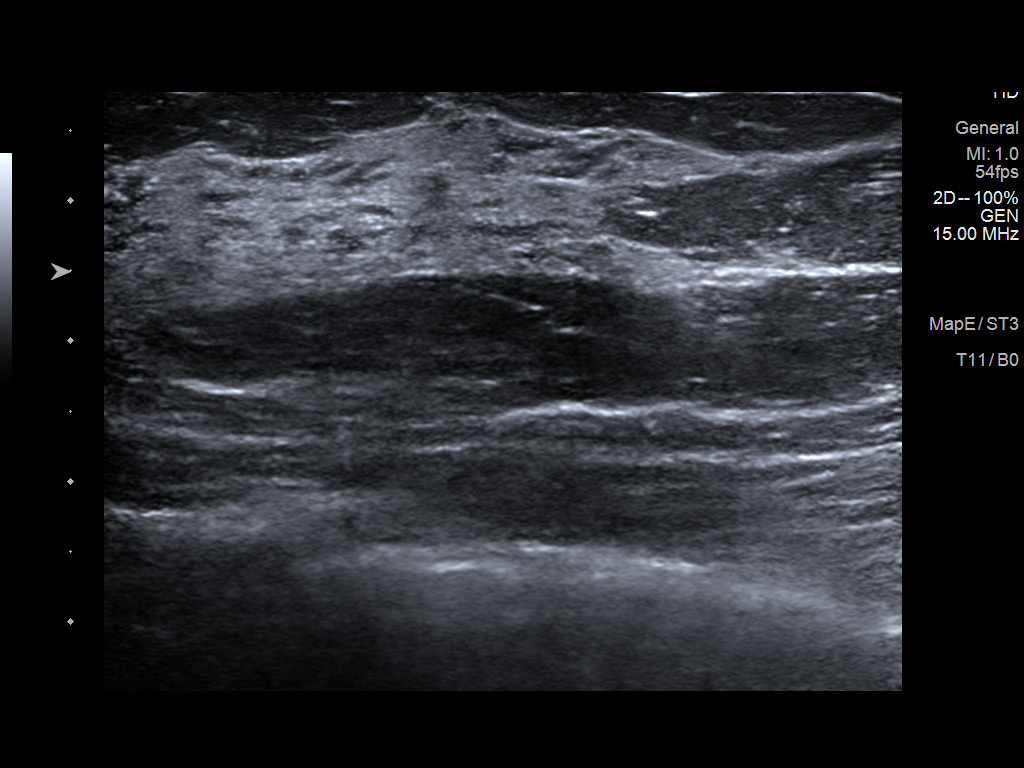
[im 5/7]
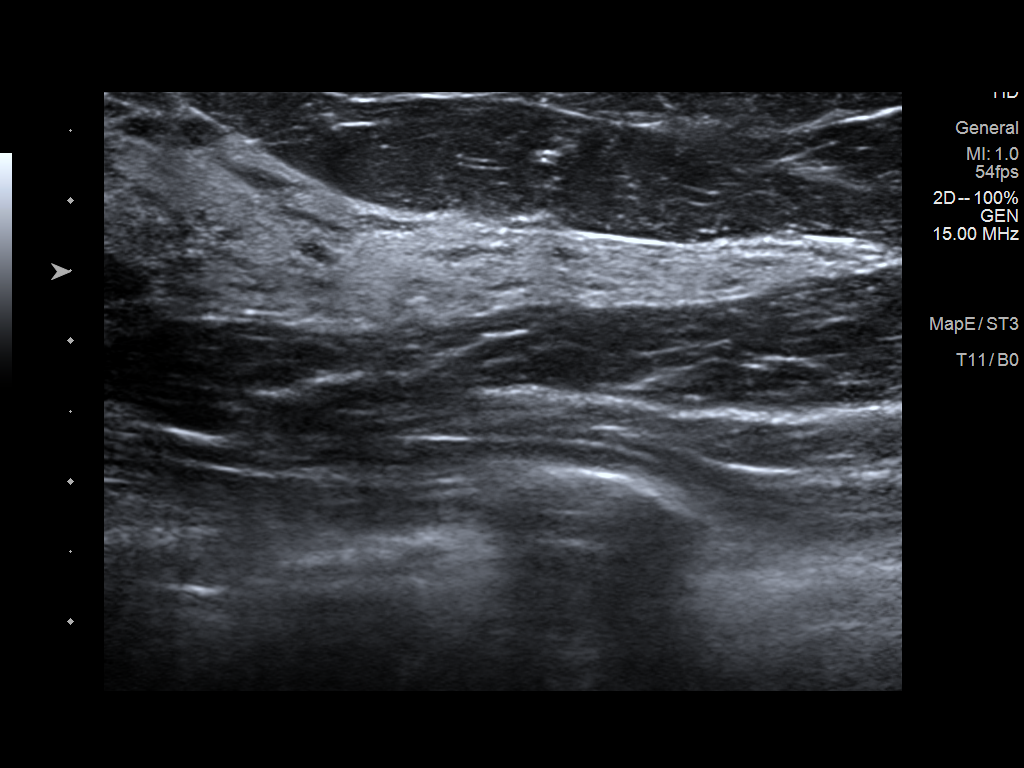
[im 6/7]
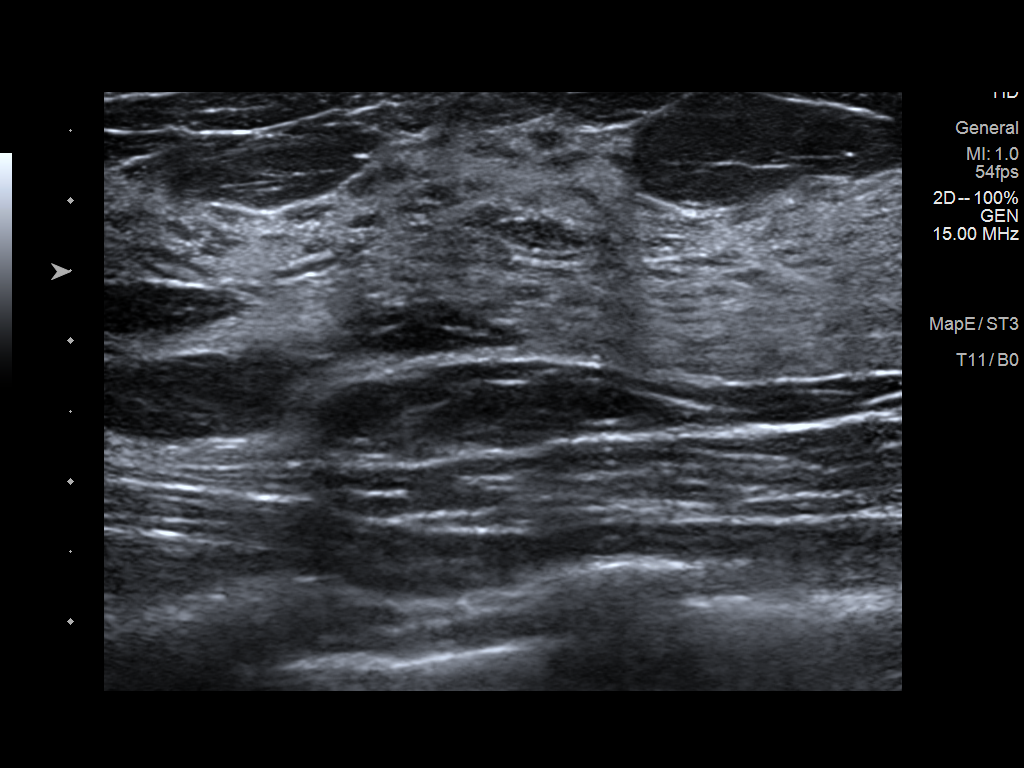
[im 7/7]
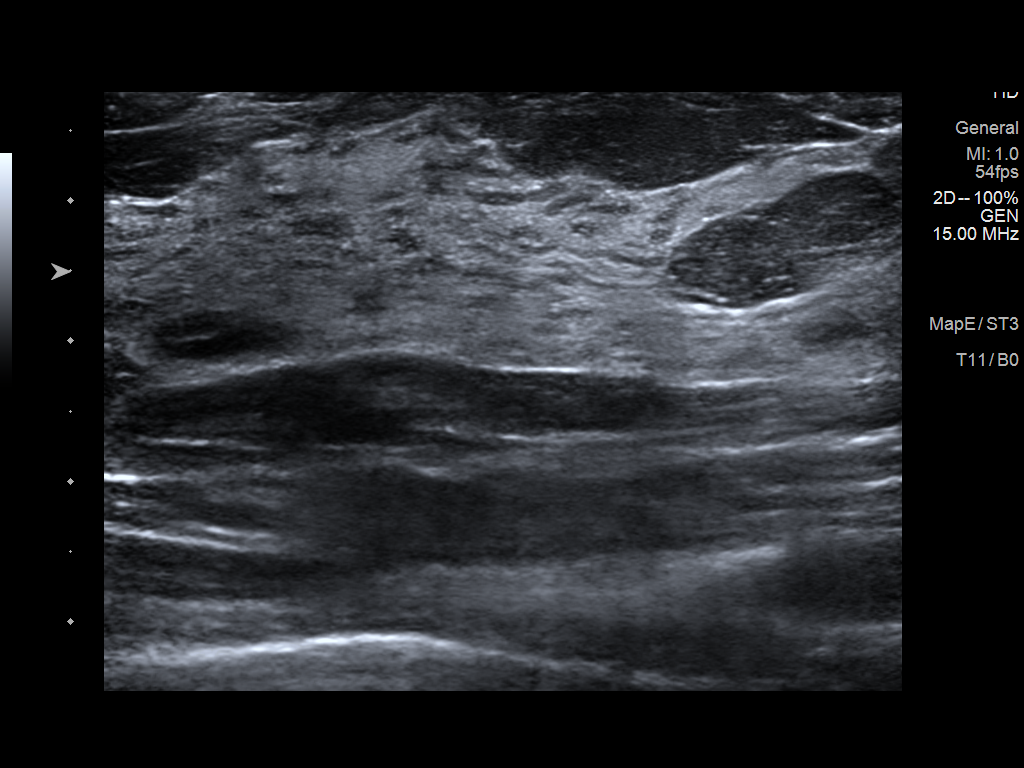

[7 of 7 positions shown; findings below may reference images not displayed]

ACR Breast Density Category c: The breast tissue is heterogeneously
dense, which may obscure small masses.
FINDINGS: No concerning masses, calcifications or distortion identified within
the right or left breast.

Mammographic images were processed with CAD.

On physical exam, dense tissue is palpated within the upper-outer
right and lower outer right breast.

Targeted ultrasound is performed, showing dense tissue within the
right breast 8 o'clock position 7 cm from nipple at the site of
palpable concern. Additionally, 4 quadrant ultrasound demonstrates
normal dense tissue without suspicious mass.
IMPRESSION: No mammographic or sonographic evidence for malignancy.

Palpable abnormality corresponds with dense tissue.

RECOMMENDATION:
Continued clinical evaluation for palpable area of concern within
the right breast.

Screening mammogram at age 39 unless there are persistent or
intervening clinical concerns. (Code:[YM])

I have discussed the findings and recommendations with the patient.
If applicable, a reminder letter will be sent to the patient
regarding the next appointment.

BI-RADS CATEGORY  1: Negative.

## 2020-04-15 ENCOUNTER — Ambulatory Visit (INDEPENDENT_AMBULATORY_CARE_PROVIDER_SITE_OTHER): Payer: Self-pay | Admitting: Family Medicine

## 2020-04-15 ENCOUNTER — Ambulatory Visit (INDEPENDENT_AMBULATORY_CARE_PROVIDER_SITE_OTHER): Payer: Self-pay

## 2020-04-15 ENCOUNTER — Other Ambulatory Visit: Payer: Self-pay

## 2020-04-15 ENCOUNTER — Encounter: Payer: Self-pay | Admitting: Family Medicine

## 2020-04-15 VITALS — BP 100/70 | HR 86 | Ht 68.0 in | Wt 160.0 lb

## 2020-04-15 DIAGNOSIS — R079 Chest pain, unspecified: Secondary | ICD-10-CM

## 2020-04-15 DIAGNOSIS — M940 Chondrocostal junction syndrome [Tietze]: Secondary | ICD-10-CM

## 2020-04-15 MED ORDER — PREDNISONE 50 MG PO TABS: ORAL_TABLET | ORAL | 0 refills | Status: DC

## 2020-04-15 NOTE — Progress Notes (Signed)
[facility] [address] [place] Phone: [phone] Subjective:   I Janice [name] am serving as a Education administrator for Dr. Hulan Saas.  This visit occurred during the SARS-CoV-2 public health emergency.  Safety protocols were in place, including screening questions prior to the visit, additional usage of staff PPE, and extensive cleaning of exam room while observing appropriate contact time as indicated for disinfecting solutions.   I'm seeing this patient by the request  of:  Aycock, Ngwe A, MD  CC: Right breast pain  [id number]  Janice [name] is a 29 y.o. female coming in with complaint of right side/ chest pain. States she initially felt the pain in her breast. Had an ultrasound and mammogram which was normal. Patient is active and works out regularly. Remembers doing a lot of chest exercise before she started experiencing her pain. Was seen by ED and had a normal chest xray.No issues with breathing. Has decreased her exercise. Mostly walking no running or lifting.   Onset- Chronic (Feburary) Location -pectoral, intercostal muscles  Duration-  Character- sharp, burning  Aggravating factors- pulled muscle or muscle spasm  Reliving factors-  Therapies tried- topical, muscle relaxant, ice compression and sleeping upright  Severity- 10 at its worse  Patient is already had a mammogram and a ultrasound of the right breast tissue that were independently visualized by me showing no significant masses noted.  Patient has seen a specialist as well concern for the potential for reflux with some mild improvement.    No past medical history on file. Past Surgical History:  Procedure Laterality Date  . BREAST SURGERY     Social History   Socioeconomic History  . Marital status: Single    Spouse name: Not on file  . Number of children: 0  . Years of education: Not on file  . Highest education level: Master's degree (e.g., MA, MS, MEng, MEd,  MSW, MBA)  Occupational History  . Not on file  Tobacco Use  . Smoking status: Never Smoker  . Smokeless tobacco: Never Used  Substance and Sexual Activity  . Alcohol use: Never  . Drug use: Never  . Sexual activity: Not Currently    Birth control/protection: Pill  Other Topics Concern  . Not on file  Social History Narrative  . Not on file   Social Determinants of Health   Financial Resource Strain:   . Difficulty of Paying Living Expenses:   Food Insecurity:   . Worried About Charity fundraiser in the Last Year:   . Arboriculturist in the Last Year:   Transportation Needs: No Transportation Needs  . Lack of Transportation (Medical): No  . Lack of Transportation (Non-Medical): No  Physical Activity:   . Days of Exercise per Week:   . Minutes of Exercise per Session:   Stress:   . Feeling of Stress :   Social Connections:   . Frequency of Communication with Friends and Family:   . Frequency of Social Gatherings with Friends and Family:   . Attends Religious Services:   . Active Member of Clubs or Organizations:   . Attends Archivist Meetings:   Marland Kitchen Marital Status:    No Known Allergies Family History  Problem Relation Age of Onset  . Hypertension Mother   . Hyperlipidemia Father   . Hypertension Sister     Current Outpatient Medications (Endocrine & Metabolic):  .  norethindrone (MICRONOR) 0.35 MG tablet, Take 1  tablet by mouth daily. .  predniSONE (DELTASONE) 50 MG tablet, Take one tablet daily for the next 5 days.   Current Outpatient Medications (Respiratory):  .  cetirizine (ZYRTEC) 10 MG tablet, Take 10 mg by mouth daily as needed (for seasonal allergies).  .  fluticasone (FLONASE) 50 MCG/ACT nasal spray, Place 2 sprays into both nostrils daily as needed (for seasonal allergies).   Current Outpatient Medications (Analgesics):  .  naproxen (NAPROSYN) 500 MG tablet, Take 500 mg by mouth 2 (two) times daily as needed for pain.   Current  Outpatient Medications (Other):  .  diclofenac Sodium (VOLTAREN) 1 % GEL, Apply 4 g topically 4 (four) times daily. Marland Kitchen  lidocaine (LIDODERM) 5 %, Place 1 patch onto the skin daily. Remove & Discard patch within 12 hours or as directed by MD .  methocarbamol (ROBAXIN) 500 MG tablet, Take 1 tablet (500 mg total) by mouth 2 (two) times daily. .  Multiple Vitamins-Minerals (ONE-A-DAY WOMENS PO), Take 1 tablet by mouth daily with breakfast.    Reviewed prior external information including notes and imaging from  primary care provider As well as notes that were available from care everywhere and other healthcare systems.  Past medical history, social, surgical and family history all reviewed in electronic medical record.  No pertanent information unless stated regarding to the chief complaint.   Review of Systems:  No headache, visual changes, nausea, vomiting, diarrhea, constipation, dizziness, abdominal pain, skin rash, fevers, chills, night sweats, weight loss, swollen lymph nodes, body aches, joint swelling, chest pain, shortness of breath, mood changes. POSITIVE muscle aches  Objective  Blood pressure 100/70, pulse 86, height 5\' 8"  (1.727 m), weight 160 lb (72.6 kg), last menstrual period 03/30/2020, SpO2 98 %.   General: No apparent distress alert and oriented x3 mood and affect normal, dressed appropriately.  HEENT: Pupils equal, extraocular movements intact  Respiratory: Patient's speak in full sentences and does not appear short of breath  Cardiovascular: No lower extremity edema, non tender, no erythema  Neuro: Cranial nerves II through XII are intact, neurovascularly intact in all extremities with 2+ DTRs and 2+ pulses.  Gait normal with good balance and coordination.  MSK:  Non tender with full range of motion and good stability and symmetric strength and tone of shoulders, elbows, wrist, hip, knee and ankles bilaterally.  On exam no significant breast tissue masses appreciated.   Patient is more tender over the lateral border of the sternum no abdominal pain noted.  No pain in the epigastric region.  Patient has good strength of the chest wall musculature.  Full range of motion of the shoulder noted.  Limited musculoskeletal ultrasound was performed and interpreted by Lyndal Pulley  Limited ultrasound of patient's right external shoulder shows some mild cortical irregularity of the cervical spine at T7-T8.  Otherwise fairly unremarkable with no significant masses appreciated.   Impression and Recommendations:     This case required medical decision making of moderate complexity. The above documentation has been reviewed and is accurate and complete Lyndal Pulley, DO       Note: This dictation was prepared with Dragon dictation along with smaller phrase technology. Any transcriptional errors that result from this process are unintentional.

## 2020-04-15 NOTE — Assessment & Plan Note (Signed)
Patient is already had significant work-up at this moment including a mammogram as well as ultrasound and only showing dense breast tissue.  No masses appreciated.  On musculoskeletal ultrasound some very mild irritation noted at the T7-T8 area of the sternal border that is consistent with potential inflammation.  Could be a potential cortical irregularity as well.  Patient has done naproxen but encouraged her to hold that at the moment.  Patient given prednisone which she has not done during this course of time.  Patient given home exercises and icing regimen.  Patient will send a message in 2 weeks to make sure that she is improving appropriately.

## 2020-04-15 NOTE — Patient Instructions (Addendum)
Send me a message in 2-3 weeks to let me know how you are doing Exercises  Prednisone 5 days then Duexis for the next 3 days Tart cherry extract 1200mg  at night

## 2020-04-16 ENCOUNTER — Telehealth: Payer: Self-pay | Admitting: Family Medicine

## 2020-04-16 NOTE — Telephone Encounter (Signed)
Patient called asking if someone could call her regarding questions that she had after her appointment yesterday. She would not go into detail with me regarding them.

## 2020-04-16 NOTE — Telephone Encounter (Signed)
Spoke with patient about questions. Asked if she should wear a sports bra to bed. Recommended to use if it relieves pain. Wanted to know how often to ice. Recommended 20 minutes 2x a day. States that she is achy from the exam yesterday. Has just started prednisone. Recommend that she give it a few more days for prednisone to kick in but can use Tylenol for pain until then. Is to not start Duexis until she is done with Prednisone. Patient voices understanding.

## 2020-04-21 ENCOUNTER — Encounter: Payer: Self-pay | Admitting: Family Medicine

## 2020-04-21 MED ORDER — PREDNISONE 50 MG PO TABS: ORAL_TABLET | ORAL | 0 refills | Status: DC

## 2020-04-21 NOTE — Telephone Encounter (Signed)
I called Janice [name] back.  She is not having trouble breathing or mouth or lip or tongue swelling.  Plan stop Duexis and restart prednisone.  Patient already takes Zyrtec reasonable to continue that used Benadryl as needed additionally. If needed happy to have scheduled for tomorrow morning.  However if worsening gave precautions to go to the emergency room.  Patient expresses understanding and agreement.  No shortness of breath or confusion or lethargy over the telephone today.

## 2020-04-22 NOTE — Telephone Encounter (Signed)
Patient called to let Dr Georgina Snell know that she seems to feeling better. The symptoms are slowly getting better.

## 2020-04-26 ENCOUNTER — Ambulatory Visit (INDEPENDENT_AMBULATORY_CARE_PROVIDER_SITE_OTHER): Payer: Self-pay | Admitting: Family Medicine

## 2020-04-26 ENCOUNTER — Encounter: Payer: Self-pay | Admitting: Family Medicine

## 2020-04-26 ENCOUNTER — Other Ambulatory Visit: Payer: Self-pay | Admitting: *Deleted

## 2020-04-26 DIAGNOSIS — M940 Chondrocostal junction syndrome [Tietze]: Secondary | ICD-10-CM

## 2020-04-26 MED ORDER — OMEPRAZOLE 40 MG PO CPDR: 40.0000 mg | DELAYED_RELEASE_CAPSULE | Freq: Two times a day (BID) | ORAL | 11 refills | Status: DC

## 2020-04-26 NOTE — Telephone Encounter (Signed)
Patient called back this morning to let us know that she is doing better. She asked if Dr Tamala Julian (or someone) could call her to discuss a few things in addition to her reaction.

## 2020-04-26 NOTE — Assessment & Plan Note (Signed)
Costochondritis.  Patient has had difficulty.  Patient did have potentially a drug reaction but I do think this is highly unlikely with patient taking anti-inflammatories previously.  Questionable eosinophilic esophagitis could be within the differential.  Discussed home exercises and icing regimen, which activities to do which wants to avoid.  Patient will come back and see me again after 2 weeks on the Prilosec and we will see how patient responds.

## 2020-04-26 NOTE — Progress Notes (Addendum)
Virtual Visit via Video Note  I connected with Janice [name] on 04/26/20 at 11:30 AM EDT by a video enabled telemedicine application and verified that I am speaking with the correct person using two identifiers.  Location: Patient: in work setting and only on e on the phone call difficulty with virtual platform Provider: in office    I discussed the limitations of evaluation and management by telemedicine and the availability of in person appointments. The patient expressed understanding and agreed to proceed.  History of Present Illness: 29 yo with costochondritis. Had reaction to the duexis.  Patient states that there was some tingling more in the upper back.  Patient states that it did not seem to make the chest more painful initially.  Was doing better with the prednisone and has restarted it.  Patient feels like she is making some progress again and is feeling significantly better than last week.  Patient though still and is not pain-free in the chest and is wondering if she is able to start increasing activity or not though.    Observations/Objective: Alert and oriented x3, very pleasant, patient does have significant anxiety at baseline.   Assessment and Plan:Costochondritis.  Patient has had difficulty.  Patient did have potentially a drug reaction but I do think this is highly unlikely with patient taking anti-inflammatories previously.  Questionable eosinophilic esophagitis could be within the differential.  Discussed home exercises and icing regimen, which activities to do which wants to avoid.  Patient will come back and see me again after 2 weeks on the Prilosec and we will see how patient responds.   Follow Up Instructions: 2 weeks in office and consider labs     I discussed the assessment and treatment plan with the patient. The patient was provided an opportunity to ask questions and all were answered. The patient agreed with the plan and demonstrated an understanding of the  instructions.   The patient was advised to call back or seek an in-person evaluation if the symptoms worsen or if the condition fails to improve as anticipated.  I provided 12 minutes of face-to-face time during this encounter. Once again mostly on phone due to difficulty with virtual [place], DO

## 2020-04-27 ENCOUNTER — Telehealth: Payer: Self-pay | Admitting: Family Medicine

## 2020-04-27 NOTE — Telephone Encounter (Signed)
Spoke with patient. Confirmed that she should take Prilosec until next appointment and to not use NSAIDs at this time. Patient voices understanding.

## 2020-04-27 NOTE — Telephone Encounter (Signed)
Patient called asking if someone could call her to discuss the current medication changes. She wanted to make sure that she was taking the correct things.  She also was confused by the information she saw on MyChart after her phone call with Dr Tamala Julian. It said "31 minutes face to face/Video Visit" but she only talked to him for 15 minutes and it was on the phone. It also said "Patient in home alone" but she was at work...  Please advise.

## 2020-04-27 NOTE — Telephone Encounter (Signed)
Left message for patient to call back  

## 2020-05-14 ENCOUNTER — Ambulatory Visit (INDEPENDENT_AMBULATORY_CARE_PROVIDER_SITE_OTHER): Payer: Medicaid Other | Admitting: Family Medicine

## 2020-05-14 ENCOUNTER — Other Ambulatory Visit: Payer: Self-pay

## 2020-05-14 ENCOUNTER — Encounter: Payer: Self-pay | Admitting: Gastroenterology

## 2020-05-14 VITALS — BP 118/82 | HR 90 | Ht 68.0 in | Wt 163.0 lb

## 2020-05-14 DIAGNOSIS — M2550 Pain in unspecified joint: Secondary | ICD-10-CM

## 2020-05-14 DIAGNOSIS — K219 Gastro-esophageal reflux disease without esophagitis: Secondary | ICD-10-CM

## 2020-05-14 DIAGNOSIS — R0789 Other chest pain: Secondary | ICD-10-CM

## 2020-05-14 LAB — VITAMIN D 25 HYDROXY (VIT D DEFICIENCY, FRACTURES): VITD: 43.26 ng/mL (ref 30.00–100.00)

## 2020-05-14 LAB — CBC WITH DIFFERENTIAL/PLATELET
Basophils Absolute: 0 10*3/uL (ref 0.0–0.1)
Basophils Relative: 0.4 % (ref 0.0–3.0)
Eosinophils Absolute: 0.1 10*3/uL (ref 0.0–0.7)
Eosinophils Relative: 1.2 % (ref 0.0–5.0)
HCT: 39.2 % (ref 36.0–46.0)
Hemoglobin: 13.1 g/dL (ref 12.0–15.0)
Lymphocytes Relative: 26.7 % (ref 12.0–46.0)
Lymphs Abs: 2.1 10*3/uL (ref 0.7–4.0)
MCHC: 33.4 g/dL (ref 30.0–36.0)
MCV: 93.7 fl (ref 78.0–100.0)
Monocytes Absolute: 0.6 10*3/uL (ref 0.1–1.0)
Monocytes Relative: 6.9 % (ref 3.0–12.0)
Neutro Abs: 5.2 10*3/uL (ref 1.4–7.7)
Neutrophils Relative %: 64.8 % (ref 43.0–77.0)
Platelets: 225 10*3/uL (ref 150.0–400.0)
RBC: 4.18 Mil/uL (ref 3.87–5.11)
RDW: 12.7 % (ref 11.5–15.5)
WBC: 8 10*3/uL (ref 4.0–10.5)

## 2020-05-14 LAB — COMPREHENSIVE METABOLIC PANEL
ALT: 15 U/L (ref 0–35)
AST: 17 U/L (ref 0–37)
Albumin: 4.7 g/dL (ref 3.5–5.2)
Alkaline Phosphatase: 72 U/L (ref 39–117)
BUN: 10 mg/dL (ref 6–23)
CO2: 29 mEq/L (ref 19–32)
Calcium: 9.5 mg/dL (ref 8.4–10.5)
Chloride: 100 mEq/L (ref 96–112)
Creatinine, Ser: 0.89 mg/dL (ref 0.40–1.20)
GFR: 90.27 mL/min (ref >60.00)
Glucose, Bld: 94 mg/dL (ref 70–99)
Potassium: 3.4 mEq/L — ABNORMAL LOW (ref 3.5–5.1)
Sodium: 136 mEq/L (ref 135–145)
Total Bilirubin: 0.5 mg/dL (ref 0.2–1.2)
Total Protein: 7.6 g/dL (ref 6.0–8.3)

## 2020-05-14 LAB — URIC ACID: Uric Acid, Serum: 5 mg/dL (ref 2.4–7.0)

## 2020-05-14 LAB — C-REACTIVE PROTEIN: CRP: <1 mg/dL (ref 0.5–20.0)

## 2020-05-14 LAB — IBC PANEL
Iron: 112 ug/dL (ref 42–145)
Saturation Ratios: 31.3 % (ref 20.0–50.0)
Transferrin: 256 mg/dL (ref 212.0–360.0)

## 2020-05-14 LAB — FERRITIN: Ferritin: 37.4 ng/mL (ref 10.0–291.0)

## 2020-05-14 LAB — TSH: TSH: 1.62 u[IU]/mL (ref 0.35–4.50)

## 2020-05-14 LAB — SEDIMENTATION RATE: Sed Rate: 14 mm/hr (ref 0–20)

## 2020-05-14 NOTE — Patient Instructions (Addendum)
Labs today GI will call to set up a visit Let us know once you have GI date and we can see you after that Continue to watch food choices and stress level

## 2020-05-14 NOTE — Progress Notes (Signed)
[place] Phone: [phone] Subjective:   Janice [name], am serving as a scribe for Dr. Hulan Saas. This visit occurred during the SARS-CoV-2 public health emergency.  Safety protocols were in place, including screening questions prior to the visit, additional usage of staff PPE, and extensive cleaning of exam room while observing appropriate contact time as indicated for disinfecting solutions.   I'm seeing this patient by the request  of:  Donnie Coffin, MD  CC:   [id number]   04/26/2020 29 yo with costochondritis. Had reaction to the duexis.  Patient states that there was some tingling more in the upper back.  Patient states that it did not seem to make the chest more painful initially.  Was doing better with the prednisone and has restarted it.  Patient feels like she is making some progress again and is feeling significantly better than last week.  Patient though still and is not pain-free in the chest and is wondering if she is able to start increasing activity or not though.   Update 05/14/2020 Janice [name] is a 29 y.o. female coming in with complaint of costochondritis. Patient put on Prilosec. Patient states that her pain is over sternum. Is [name] longer having flare ups with costochondritis. Continues to feel food is getting caught on left side of chest. Does note that over the past year she has been more bloated and gaseous. Has been walking and using stationary biking. HEP causes soreness but [name] pain. Finished Prilosec yesterday. Unsure if this is helping or not.        [name] past medical history on file. Past Surgical History:  Procedure Laterality Date  . BREAST SURGERY     Social History   Socioeconomic History  . Marital status: Single    Spouse name: Not on file  . Number of children: 0  . Years of education: Not on file  . Highest education level: Master's degree (e.g., MA, MS,  MEng, MEd, MSW, MBA)  Occupational History  . Not on file  Tobacco Use  . Smoking status: Never Smoker  . Smokeless tobacco: Never Used  Substance and Sexual Activity  . Alcohol use: Never  . Drug use: Never  . Sexual activity: Not Currently    Birth control/protection: Pill  Other Topics Concern  . Not on file  Social History Narrative  . Not on file   Social Determinants of Health   Financial Resource Strain:   . Difficulty of Paying Living Expenses:   Food Insecurity:   . Worried About Charity fundraiser in the Last Year:   . Arboriculturist in the Last Year:   Transportation Needs: [name] Transportation Needs  . Lack of Transportation (Medical): [name]  . Lack of Transportation (Non-Medical): [name]  Physical Activity:   . Days of Exercise per Week:   . Minutes of Exercise per Session:   Stress:   . Feeling of Stress :   Social Connections:   . Frequency of Communication with Friends and Family:   . Frequency of Social Gatherings with Friends and Family:   . Attends Religious Services:   . Active Member of Clubs or Organizations:   . Attends Archivist Meetings:   Marland Kitchen Marital Status:    [name] Known Allergies Family History  Problem Relation Age of Onset  . Hypertension Mother   . Hyperlipidemia Father   . Hypertension Sister  Current Outpatient Medications (Endocrine & Metabolic):  .  norethindrone (MICRONOR) 0.35 MG tablet, Take 1 tablet by mouth daily. .  predniSONE (DELTASONE) 50 MG tablet, Take one tablet daily for the next 5 days.   Current Outpatient Medications (Respiratory):  .  cetirizine (ZYRTEC) 10 MG tablet, Take 10 mg by mouth daily as needed (for seasonal allergies).  .  fluticasone (FLONASE) 50 MCG/ACT nasal spray, Place 2 sprays into both nostrils daily as needed (for seasonal allergies).   Current Outpatient Medications (Analgesics):  .  naproxen (NAPROSYN) 500 MG tablet, Take 500 mg by mouth 2 (two) times daily as needed for  pain.   Current Outpatient Medications (Other):  .  diclofenac Sodium (VOLTAREN) 1 % GEL, Apply 4 g topically 4 (four) times daily. Marland Kitchen  lidocaine (LIDODERM) 5 %, Place 1 patch onto the skin daily. Remove & Discard patch within 12 hours or as directed by MD .  methocarbamol (ROBAXIN) 500 MG tablet, Take 1 tablet (500 mg total) by mouth 2 (two) times daily. .  Multiple Vitamins-Minerals (ONE-A-DAY WOMENS PO), Take 1 tablet by mouth daily with breakfast.  .  omeprazole (PRILOSEC) 40 MG capsule, Take 1 capsule (40 mg total) by mouth 2 (two) times daily. Take in the morning and at dinnertime.   Reviewed prior external information including notes and imaging from  primary care provider As well as notes that were available from care everywhere and other healthcare systems.  Past medical history, social, surgical and family history all reviewed in electronic medical record.  [name] pertanent information unless stated regarding to the chief complaint.   Review of Systems:  [name] headache, visual changes, nausea, vomiting, diarrhea, constipation, dizziness, abdominal pain, skin rash, fevers, chills, night sweats, weight loss, swollen lymph nodes, body aches, joint swelling, chest pain, shortness of breath, mood changes. POSITIVE muscle aches  Objective  Blood pressure 118/82, pulse 90, height 5\' 8"  (1.727 m), weight 163 lb (73.9 kg), SpO2 99 %.   General: [name] apparent distress alert and oriented x3 mood and affect normal, dressed appropriately.  Patient does appear to be fairly anxious at baseline. HEENT: Pupils equal, extraocular movements intact  Respiratory: Patient's speak in full sentences and does not appear short of breath  Cardiovascular: [name] lower extremity edema, non tender, [name] erythema  Neuro: Cranial nerves II through XII are intact, neurovascularly intact in all extremities with 2+ DTRs and 2+ pulses.  Gait normal with good balance and coordination.  MSK:  Non tender with full range of motion  and good stability and symmetric strength and tone of shoulders, elbows, wrist, hip, knee and ankles bilaterally.  Chest exam today [name] significant tenderness of the pericoccygeal area.    Impression and Recommendations:     This case required medical decision making of moderate complexity. The above documentation has been reviewed and is accurate and complete Lyndal Pulley, DO       Note: This dictation was prepared with Dragon dictation along with smaller phrase technology. Any transcriptional errors that result from this process are unintentional.

## 2020-05-15 ENCOUNTER — Encounter: Payer: Self-pay | Admitting: Family Medicine

## 2020-05-15 DIAGNOSIS — R0789 Other chest pain: Secondary | ICD-10-CM | POA: Insufficient documentation

## 2020-05-15 NOTE — Assessment & Plan Note (Signed)
Patient has had more chest wall pain.  Patient going with the costochondritis seems to be completely relieved.  Has had good response along with the PPI and I do want to refer patient to gastroenterology for further evaluation for potential GI pathology that could be contributing to some of the discomfort.  Encourage patient to start increasing activity continue supplementations.  Naproxen noted as well.  We did go over multiple multiple questions that patient had today.  Laboratory work-up ordered secondary.  Patient still concerned about increasing activity.  Follow-up with me again 6 to 8 weeks

## 2020-05-18 LAB — ANGIOTENSIN CONVERTING ENZYME: Angiotensin-Converting Enzyme: 19 U/L (ref 9–67)

## 2020-05-18 LAB — CYCLIC CITRUL PEPTIDE ANTIBODY, IGG: Cyclic Citrullin Peptide Ab: <16 UNITS

## 2020-05-18 LAB — CALCIUM, IONIZED: Calcium, Ion: 5.17 mg/dL (ref 4.8–5.6)

## 2020-05-18 LAB — ANA: Anti Nuclear Antibody (ANA): NEGATIVE

## 2020-05-18 LAB — PTH, INTACT AND CALCIUM
Calcium: 9.7 mg/dL (ref 8.6–10.2)
PTH: 30 pg/mL (ref 14–64)

## 2020-05-18 LAB — RHEUMATOID FACTOR: Rheumatoid fact SerPl-aCnc: <14 IU/mL (ref <14)

## 2020-05-19 ENCOUNTER — Encounter: Payer: Self-pay | Admitting: Family Medicine

## 2020-07-13 ENCOUNTER — Ambulatory Visit (INDEPENDENT_AMBULATORY_CARE_PROVIDER_SITE_OTHER): Payer: Self-pay | Admitting: Gastroenterology

## 2020-07-13 ENCOUNTER — Encounter: Payer: Self-pay | Admitting: Gastroenterology

## 2020-07-13 VITALS — BP 130/70 | HR 68 | Ht 68.0 in | Wt 163.0 lb

## 2020-07-13 DIAGNOSIS — K219 Gastro-esophageal reflux disease without esophagitis: Secondary | ICD-10-CM

## 2020-07-13 DIAGNOSIS — M940 Chondrocostal junction syndrome [Tietze]: Secondary | ICD-10-CM

## 2020-07-13 DIAGNOSIS — R140 Abdominal distension (gaseous): Secondary | ICD-10-CM

## 2020-07-13 NOTE — Progress Notes (Signed)
HPI :  29 year old female with a history of suspected costochondritis and GERD, referred by Tomasa Hose MD for chest pain / possible reflux, and bloating.   She states in February she was having chest wall pain, diagnosed with costochondritis, was having severe symptoms that led to an ED visit where she had a work-up that did not reveal another cause.  She was treated with a variety of regimens and ultimately sent to Dr. Gardenia Phlegm where she received some prednisone and then Prilosec. She states in general she has been doing much better lately.  She states that her chest wall pain has mostly resolved.  She has an occasional "knot" in her chest to palpation at times from this but generally has been doing much better.  During the time of symptoms she states she had had some mild odynophagia, no dysphagia.  She did have some pyrosis periodically but no regurgitation.  She has had spicy food sometimes trigger some of the symptoms.  She has used chewable antacids in the past which have helped with some symptoms.  She states she completed about 15 days worth of Prilosec twice a day and she did not have any heartburn during that time and her chest discomfort has improved.  She has had no nausea or vomiting.  No abdominal pains otherwise.  She is becoming more active and doing exercises for her chest wall.  Her odynophagia was mild and has resolved.  She otherwise endorses feelings of being bloated and gassy over the past year or so.  She has some clear intolerances to some foods such as dairy which make the symptoms worse.  She has a bowel movement once every morning.  She does think that having a bowel movement can make her feel better.  She does not have any diarrhea or constipation otherwise.  There is no blood in her stool.   She has no family history of GI malignancies.  She states she has tried to go dairy free which has improved her symptoms somewhat but not completely resolve them.  She denies any routine  use of NSAIDs.  She has tried a probiotic she got from [facility] for the past few months, thinks it has helped her bloating somewhat.  No prior EGDs or colonoscopies.  No weight loss    Past Medical History:  Diagnosis Date  . Costochondritis   . GERD (gastroesophageal reflux disease)      Past Surgical History:  Procedure Laterality Date  . BREAST SURGERY     Family History  Problem Relation Age of Onset  . Hypertension Mother   . Hyperlipidemia Father   . Hypertension Sister    Social History   Tobacco Use  . Smoking status: Never Smoker  . Smokeless tobacco: Never Used  Vaping Use  . Vaping Use: Never used  Substance Use Topics  . Alcohol use: Never  . Drug use: Never   Current Outpatient Medications  Medication Sig Dispense Refill  . cetirizine (ZYRTEC) 10 MG tablet Take 10 mg by mouth daily as needed (for seasonal allergies).     . fluticasone (FLONASE) 50 MCG/ACT nasal spray Place 2 sprays into both nostrils daily as needed (for seasonal allergies).     . Multiple Vitamins-Minerals (ONE-A-DAY WOMENS PO) Take 1 tablet by mouth daily with breakfast.     . Probiotic Product (PROBIOTIC DAILY PO) Take by mouth daily.     No current facility-administered medications for this visit.   No Known Allergies  Review of Systems: All systems reviewed and negative except where noted in HPI.   Lab Results  Component Value Date   WBC 8.0 05/14/2020   HGB 13.1 05/14/2020   HCT 39.2 05/14/2020   MCV 93.7 05/14/2020   PLT 225.0 05/14/2020    Lab Results  Component Value Date   CREATININE 0.89 05/14/2020   BUN 10 05/14/2020   NA 136 05/14/2020   K 3.4 (L) 05/14/2020   CL 100 05/14/2020   CO2 29 05/14/2020    Lab Results  Component Value Date   ALT 15 05/14/2020   AST 17 05/14/2020   ALKPHOS 72 05/14/2020   BILITOT 0.5 05/14/2020   Lab Results  Component Value Date   IRON 112 05/14/2020   FERRITIN 37.4 05/14/2020   ESR and CRP normal  Physical Exam: BP  (!) 130/70   Pulse 68   Ht '5\' 8"'$  (1.727 m)   Wt 163 lb (73.9 kg)   LMP 07/02/2020 (Approximate)   BMI 24.78 kg/m  Constitutional: Pleasant,well-developed, female in no acute distress. HEENT: Normocephalic and atraumatic. Conjunctivae are normal. No scleral icterus. Neck supple.  Cardiovascular: Normal rate, regular rhythm.  Pulmonary/chest: Effort normal and breath sounds normal.  Abdominal: Soft, nondistended, nontender.There are no masses palpable.  Extremities: no edema Lymphadenopathy: No cervical adenopathy noted. Neurological: Alert and oriented to person place and time. Skin: Skin is warm and dry. No rashes noted. Psychiatric: Normal mood and affect. Behavior is normal.   ASSESSMENT AND PLAN: 29 year old female here for new patient assessment the following:  GERD / costochondritis - was treated for suspected costochondritis which improved, during this time she also had some reflux symptoms and mild odynophagia, very possible she had some mild esophagitis, given trial of PPI and treated for costochondritis and her symptoms have mostly resolved.  .  We discussed options moving forward.  Recommend using Prilosec OTC or Pepcid OTC as needed for intermittent reflux symptoms moving forward, sounds like she does not need this daily at this time.  I do not think we need to pursue endoscopy given her symptoms resolved with appropriate therapy.  If she has persistent symptoms despite medication or dysphagia or other alarm symptoms that we discussed, would then recommend endoscopy, she can follow-up with [place] as needed if any of these symptoms occur.  She can follow-up as needed for this issue moving forward.  Bloating - ongoing intermittent bloating with some food intolerances.  We discussed pathophysiology of intestinal gas and bloating.  Her labs are normal and reassuring.  She has had some benefit with some dietary changes and eliminating known food triggers.  I reviewed a low FODMAP diet with  her and provided handouts, she will see if she is ingesting any high risk foods on a routine basis that could be contributing to this.  She has tried a probiotic and she can continue that if she finds it helps.  Also recommended using Gas-X as needed.  If symptoms persist despite these measures over time she can contact me for further advice, or follow-up as needed for this.  All questions answered she agreed with the plan  Vera Cruz Cellar, MD [facility]  CC: Donnie Coffin, MD

## 2020-07-13 NOTE — Patient Instructions (Addendum)
If you are age 30 or older, your body mass index should be between 23-30. Your Body mass index is 24.78 kg/m. If this is out of the aforementioned range listed, please consider follow up with your Primary Care Provider.  If you are age 59 or younger, your body mass index should be between 19-25. Your Body mass index is 24.78 kg/m. If this is out of the aformentioned range listed, please consider follow up with your Primary Care Provider.   You can use omeprazole or famotidine (Pepcid) over-the-counter as needed.  Continue your probiotic.  We are giving you a Low FOD-MAP diet to review and follow.   Thank you for entrusting me with your care and for choosing [facility], Dr. Clyde Cellar

## 2020-07-18 DIAGNOSIS — N838 Other noninflammatory disorders of ovary, fallopian tube and broad ligament: Secondary | ICD-10-CM

## 2020-07-18 HISTORY — DX: Other noninflammatory disorders of ovary, fallopian tube and broad ligament: N83.8

## 2020-07-19 ENCOUNTER — Ambulatory Visit (INDEPENDENT_AMBULATORY_CARE_PROVIDER_SITE_OTHER): Payer: Medicaid Other | Admitting: Family Medicine

## 2020-07-19 ENCOUNTER — Encounter: Payer: Self-pay | Admitting: Family Medicine

## 2020-07-19 ENCOUNTER — Other Ambulatory Visit: Payer: Self-pay

## 2020-07-19 DIAGNOSIS — M940 Chondrocostal junction syndrome [Tietze]: Secondary | ICD-10-CM

## 2020-07-19 NOTE — Patient Instructions (Signed)
Happy you are better Do 50% of weight were you were before increasing 10% each week See me when you need me

## 2020-07-19 NOTE — Assessment & Plan Note (Signed)
Significantly decreased patient is having no significant pain at this time.  Patient can follow-up with me on an as-needed basis

## 2020-07-19 NOTE — Progress Notes (Signed)
[place] Phone: [phone] Subjective:   Janice Janice [name], am serving as [name] scribe for Janice Janice [name]. This visit occurred during the SARS-CoV-2 public health emergency.  Safety protocols were in place, including screening questions prior to the visit, additional usage of staff PPE, and extensive cleaning of exam room while observing appropriate contact time as indicated for disinfecting solutions.   I'm seeing this patient by the request  of:  Aycock, Janice A, MD  CC: Chest wall discomfort  [id number]   05/14/2020 Patient has had more chest wall pain.  Patient going with the costochondritis seems to be completely relieved.  Has had good response along with the PPI and I do want to refer patient to gastroenterology for further evaluation for potential GI pathology that could be contributing to some of the discomfort.  Encourage patient to start increasing activity continue supplementations.  Naproxen noted as well.  We did go over multiple multiple questions that patient had today.  Laboratory work-up ordered secondary.  Patient still concerned about increasing activity.  Follow-up with me again 6 to 8 weeks  Update 07/19/2020 Janice Janice [name] is [name] 29 y.o. female coming in with complaint of costochondritis. Has not had pain with trying elimination diet prescribed by GI. Has been eliminating dairy, gluten. Did try run walk [name] few weeks ago. Had soreness in chest but Janice [name] pain. Has not tried lifting.  Patient has not had any pain with her daily activities or waking her up at night.  Has not needed any pain medications in quite some time      Past Medical History:  Diagnosis Date  . Costochondritis   . GERD (gastroesophageal reflux disease)    Past Surgical History:  Procedure Laterality Date  . BREAST SURGERY     Social History   Socioeconomic History  . Marital status: Single    Spouse name: Not on file  .  Number of children: 0  . Years of education: Not on file  . Highest education level: Master's degree (e.g., MA, MS, MEng, MEd, MSW, MBA)  Occupational History  . Not on file  Tobacco Use  . Smoking status: Never Smoker  . Smokeless tobacco: Never Used  Vaping Use  . Vaping Use: Never used  Substance and Sexual Activity  . Alcohol use: Never  . Drug use: Never  . Sexual activity: Not Currently    Birth control/protection: Pill  Other Topics Concern  . Not on file  Social History Narrative  . Not on file   Social Determinants of Health   Financial Resource Strain:   . Difficulty of Paying Living Expenses:   Food Insecurity:   . Worried About Charity fundraiser in the Last Year:   . Arboriculturist in the Last Year:   Transportation Needs: Janice [name] Transportation Needs  . Lack of Transportation (Medical): Janice [name]  . Lack of Transportation (Non-Medical): Janice [name]  Physical Activity:   . Days of Exercise per Week:   . Minutes of Exercise per Session:   Stress:   . Feeling of Stress :   Social Connections:   . Frequency of Communication with Friends and Family:   . Frequency of Social Gatherings with Friends and Family:   . Attends Religious Services:   . Active Member of Clubs or Organizations:   . Attends Archivist Meetings:   Marland Kitchen Marital Status:    Janice [name] Known Allergies Family History  Problem Relation Age of Onset  . Hypertension Mother   . Hyperlipidemia Father   . Hypertension Sister       Current Outpatient Medications (Respiratory):  .  cetirizine (ZYRTEC) 10 MG tablet, Take 10 mg by mouth daily as needed (for seasonal allergies).  .  fluticasone (FLONASE) 50 MCG/ACT nasal spray, Place 2 sprays into both nostrils daily as needed (for seasonal allergies).     Current Outpatient Medications (Other):  .  b complex vitamins capsule, Take 1 capsule by mouth daily. .  Multiple Vitamins-Minerals (ONE-[name]-DAY WOMENS PO), Take 1 tablet by mouth daily with breakfast.  .   Probiotic Product (PROBIOTIC DAILY PO), Take by mouth daily.   Reviewed prior external information including notes and imaging from  primary care provider As well as notes that were available from care everywhere and other healthcare systems.  Past medical history, social, surgical and family history all reviewed in electronic medical record.  Janice [name] pertanent information unless stated regarding to the chief complaint.   Review of Systems:  Janice [name] headache, visual changes, nausea, vomiting, diarrhea, constipation, dizziness, abdominal pain, skin rash, fevers, chills, night sweats, weight loss, swollen lymph nodes, body aches, joint swelling, chest pain, shortness of breath, mood changes. POSITIVE muscle aches  Objective  Blood pressure 118/76, pulse 73, height 5\' 8"  (1.727 m), weight 163 lb (73.9 kg), last menstrual period 07/02/2020, SpO2 98 %.   General: Janice [name] apparent distress alert and oriented x3 mood and affect normal, dressed appropriately.  HEENT: Pupils equal, extraocular movements intact  Respiratory: Patient's speak in full sentences and does not appear short of breath  Cardiovascular: Janice [name] lower extremity edema, non tender, Janice [name] erythema  Neuro: Cranial nerves II through XII are intact, neurovascularly intact in all extremities with 2+ DTRs and 2+ pulses.  Gait normal with good balance and coordination.  MSK:  Non tender with full range of motion and good stability and symmetric strength and tone of shoulders, elbows, wrist, hip, knee and ankles bilaterally.  Chest wall Janice [name] significant discomfort at this time still some very mild possible irritation on the right side of the costal chondral margin   Impression and Recommendations:     The above documentation has been reviewed and is accurate and complete Janice Pulley, DO       Note: This dictation was prepared with Dragon dictation along with smaller phrase technology. Any transcriptional errors that result from this process are  unintentional.

## 2020-07-20 ENCOUNTER — Telehealth: Payer: Self-pay | Admitting: Gastroenterology

## 2020-07-20 MED ORDER — DICYCLOMINE HCL 10 MG PO CAPS
10.0000 mg | ORAL_CAPSULE | Freq: Three times a day (TID) | ORAL | 0 refills | Status: DC | PRN
Start: 2020-07-20 — End: 2021-07-08

## 2020-07-20 MED ORDER — ONDANSETRON HCL 4 MG PO TABS
4.0000 mg | ORAL_TABLET | Freq: Three times a day (TID) | ORAL | 0 refills | Status: DC | PRN
Start: 2020-07-20 — End: 2020-09-06

## 2020-07-20 NOTE — Telephone Encounter (Signed)
See phone notes from today.  

## 2020-07-20 NOTE — Telephone Encounter (Signed)
Janice Flock, MD  You 2 hours ago (12:04 PM)   Barbera Setters would you mind touching base with this patient to see how much distress she is having? She has had chronic bloating, not sure how much of this is new or not. If she is in a lot of discomfort can do CBC, lipase, CMET to ensure okay. Can give Zofran 4mg  ODT for nausea and may want to try some bentyl for the cramps, 10mg  q 8 hours PRN. If pain is severe and intolerant of PO, etc, may need to get imaging at the ED    Patient notified of the recommendations.  She is tolerating po fluids and is having BM.  She has some lower abdominal cramping and mild nausea. Rx sent t the pharmacy.  She will continue to communicate with MyChart.  She will call back or message for additional concerns.

## 2020-07-20 NOTE — Telephone Encounter (Signed)
Pt is requesting a call back from a nurse to discuss her stomach cramps, which started last night. Pt is feeling very bloated/nauseous. Pt would like some advice on what to do

## 2020-07-20 NOTE — Telephone Encounter (Signed)
Left message for patient to call back  

## 2020-07-20 NOTE — Telephone Encounter (Signed)
Pt states she is sorry but she should be available for a return call.

## 2020-07-27 ENCOUNTER — Telehealth (INDEPENDENT_AMBULATORY_CARE_PROVIDER_SITE_OTHER): Payer: Self-pay | Admitting: Medical

## 2020-07-27 ENCOUNTER — Encounter: Payer: Self-pay | Admitting: Medical

## 2020-07-27 DIAGNOSIS — R1084 Generalized abdominal pain: Secondary | ICD-10-CM

## 2020-07-27 NOTE — Telephone Encounter (Signed)
Called pt to address concerns. Documented call in another encounter.

## 2020-07-27 NOTE — Telephone Encounter (Signed)
Pt called after hours nurse, sent MyChart message, and spoke with front office staff member. Per chart review, pt will have her first appt with our office 08/19/20 to discuss ongoing abdominal pain.   Called pt to address concerns. Explained to pt that this is our first available new gyn appt. Reviewed symptoms of nausea, constipation, and abdominal pain. Pt states the pain is not severe. Reports being seen last week by her primary care provider and was seen by GI provider on 07/13/20. Per chart review pt was given Zofran and Bentyl at that appt. Encouraged pt to continue using those medications as needed. Pt states GI recommended using tylenol for pain management, but to not use other NSAIDS due to GI upset. Encouraged pt to go to urgent care if pain is not manageable or she develops s/s of infection. Explained pt should go to ED for any trouble breathing or heavy bleeding. Pt verbalized understanding and plans to follow up with Hillard Danker, PA on 08/19/20.

## 2020-07-27 NOTE — Telephone Encounter (Signed)
Patient called into the office stating that she needs an appointment for as soon as possible because she is having abdominal pain right below her belly button and it continues through the right side of her abdomen. Patient that she is also bloated and this has been going on for a couple days. Patient stated that the after hours nurse told her that she needs to be seen today for this concern. Patient instructed because she is not an established patient at our office a can move up her appointment and send a message to the nurses and they can evaluate her pain. Patient verbalized understanding and message sent to clinical pool.

## 2020-08-04 ENCOUNTER — Other Ambulatory Visit: Payer: Self-pay

## 2020-08-04 DIAGNOSIS — R109 Unspecified abdominal pain: Secondary | ICD-10-CM

## 2020-08-05 ENCOUNTER — Telehealth: Payer: Self-pay | Admitting: Gastroenterology

## 2020-08-05 NOTE — Telephone Encounter (Signed)
Spoke with patient - she states that she was waiting on a call regarding her CT appt. Pt advised that her appointment information was sent through My Chart because that is the way that the conversation was initiated, pt states that My Chart is not working at this time. I provided patient her appt information over the phone and advised that once we get the results Dr. Havery Moros will have to review them and give [place] his recommendations. Advised patient that if results are urgent he may give her a call himself. Pt requested that she be called for all of her results and information and asked that this be documented in her chart.

## 2020-08-06 ENCOUNTER — Other Ambulatory Visit: Payer: Self-pay

## 2020-08-06 ENCOUNTER — Ambulatory Visit (HOSPITAL_COMMUNITY)
Admission: RE | Admit: 2020-08-06 | Discharge: 2020-08-06 | Disposition: A | Discharge status: Home / Self Care | Payer: Self-pay | Source: Ambulatory Visit | Attending: Gastroenterology | Admitting: Gastroenterology

## 2020-08-06 ENCOUNTER — Telehealth: Payer: Self-pay | Admitting: Gastroenterology

## 2020-08-06 ENCOUNTER — Encounter: Payer: Self-pay | Admitting: Medical

## 2020-08-06 DIAGNOSIS — R109 Unspecified abdominal pain: Secondary | ICD-10-CM | POA: Insufficient documentation

## 2020-08-06 IMAGING — CT CT ABD-PELV W/ CM
2 of 5 series · 15 of 46 positions shown, 17 images · IV contrast (APPLIED)
Comparison: None.

CLINICAL DATA: Right lower quadrant abdominal pain x2 weeks,
nausea, constipation

EXAM:
CT ABDOMEN AND PELVIS WITH CONTRAST
TECHNIQUE: Multidetector CT imaging of the abdomen and pelvis was performed
using the standard protocol following bolus administration of
intravenous contrast.
CONTRAST:  100mL OMNIPAQUE IOHEXOL 300 MG/ML  SOLN

[Series 2: axial st · axial · 0.79mm/px · z∈[-748,-314]mm · 12 of 101 slices shown, 14 images]
[im 7/101  soft-tissue]
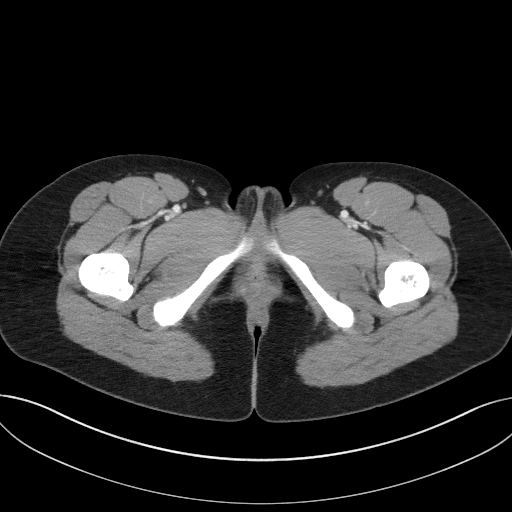
[im 7/101  bone]
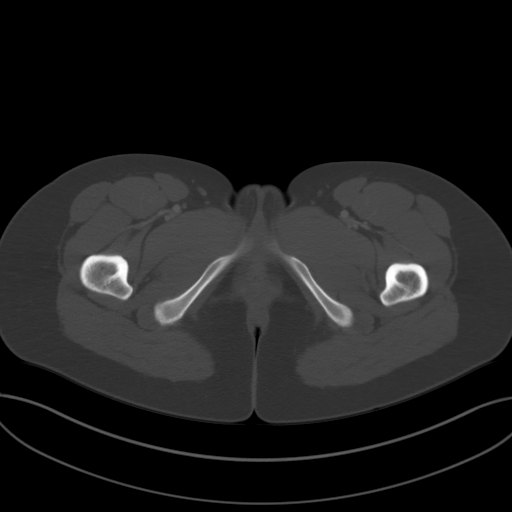
[im 14/101  soft-tissue]
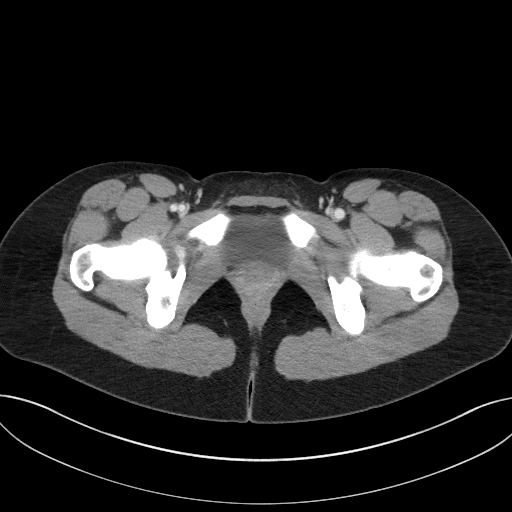
[im 21/101  soft-tissue]
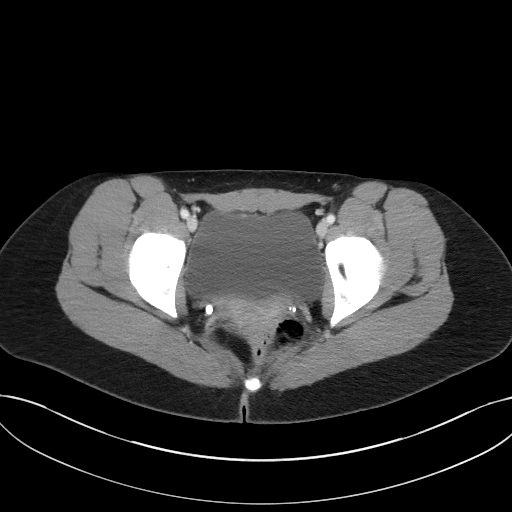
[im 34/101  soft-tissue]
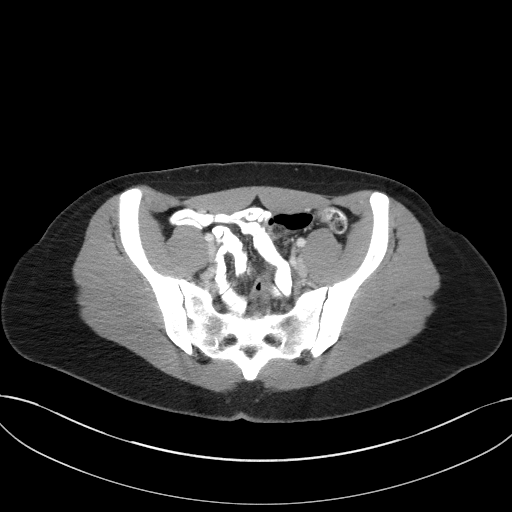
[im 41/101  soft-tissue]
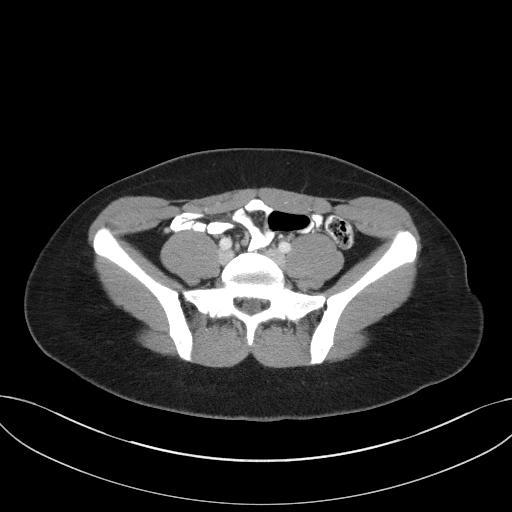
[im 47/101  soft-tissue]
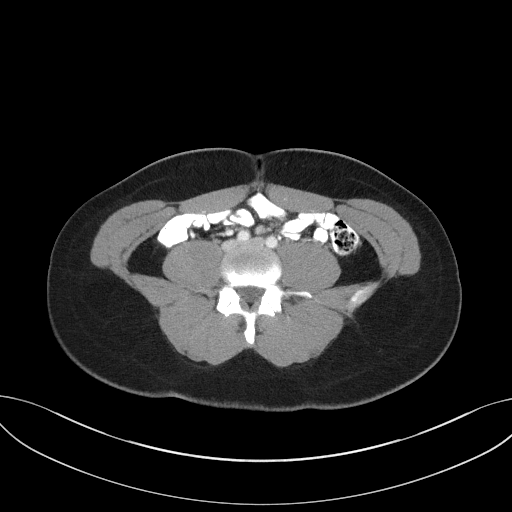
[im 54/101  soft-tissue]
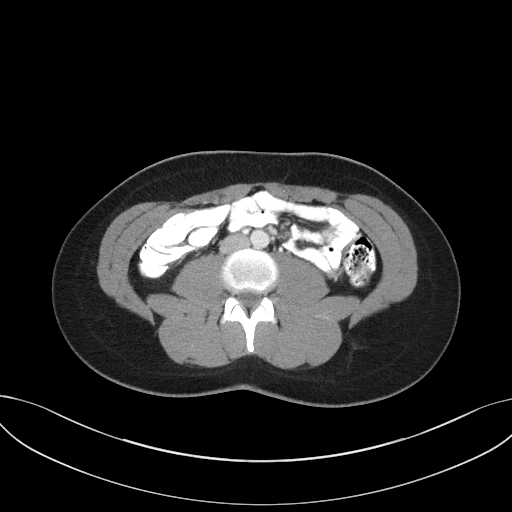
[im 61/101  soft-tissue]
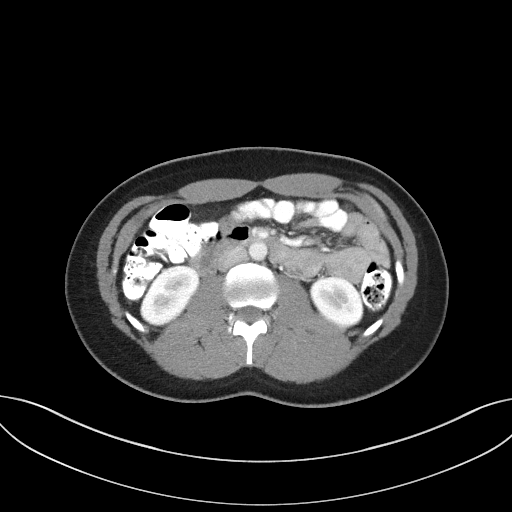
[im 67/101  soft-tissue]
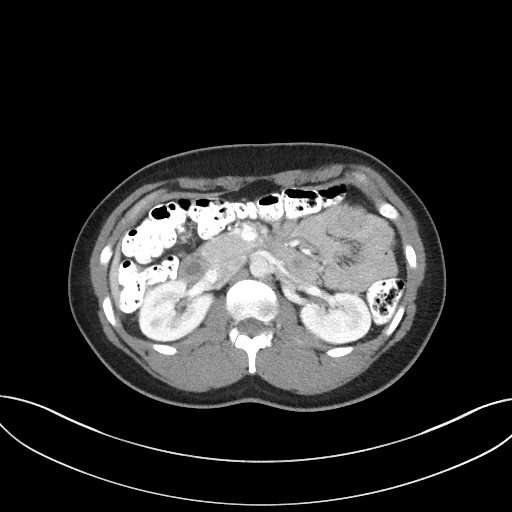
[im 67/101  bone]
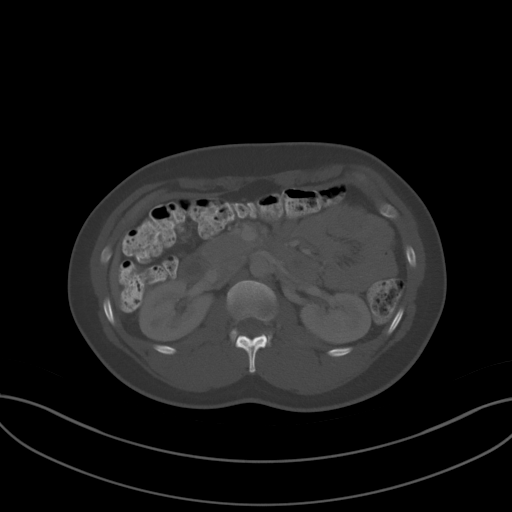
[im 81/101  soft-tissue]
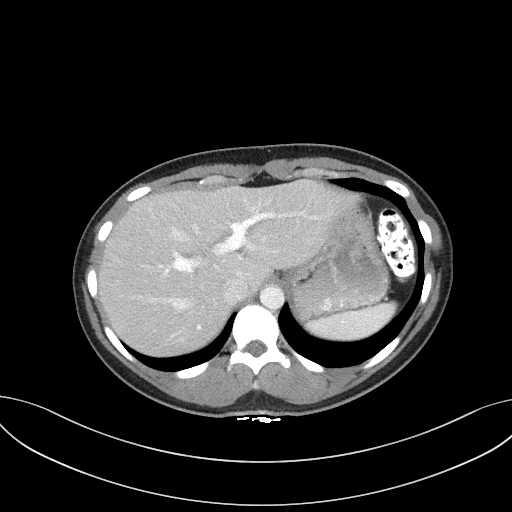
[im 87/101  soft-tissue]
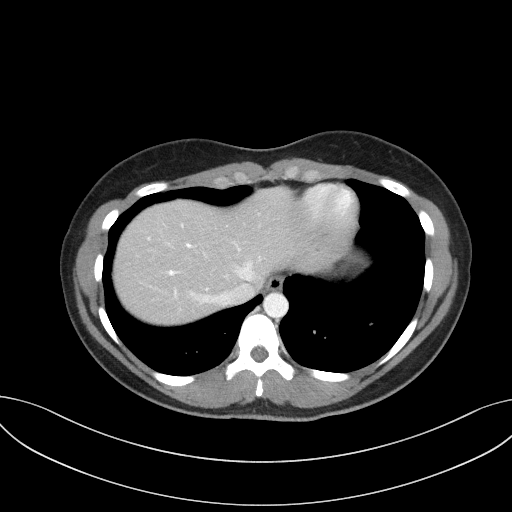
[im 94/101  soft-tissue]
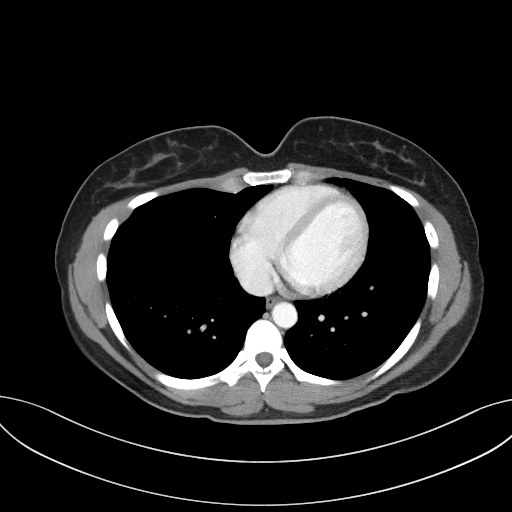

[Series 4: coronal st · coronal · 0.68mm/px · 3 of 79 slices shown]
[im 27/79  soft-tissue]
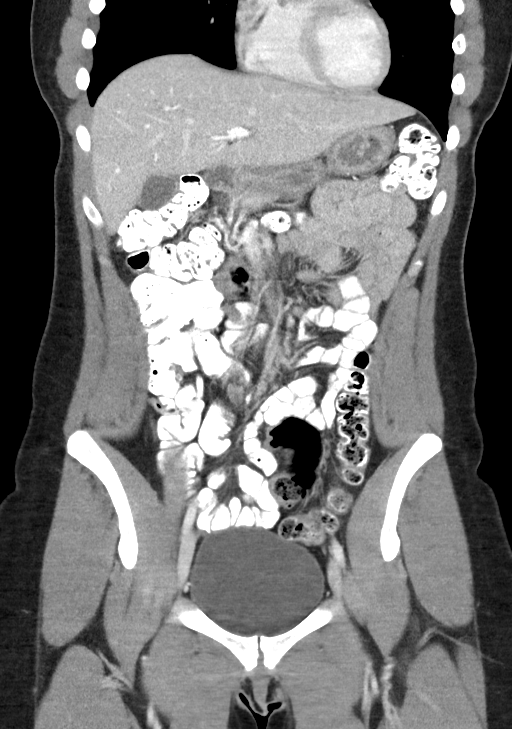
[im 35/79  soft-tissue]
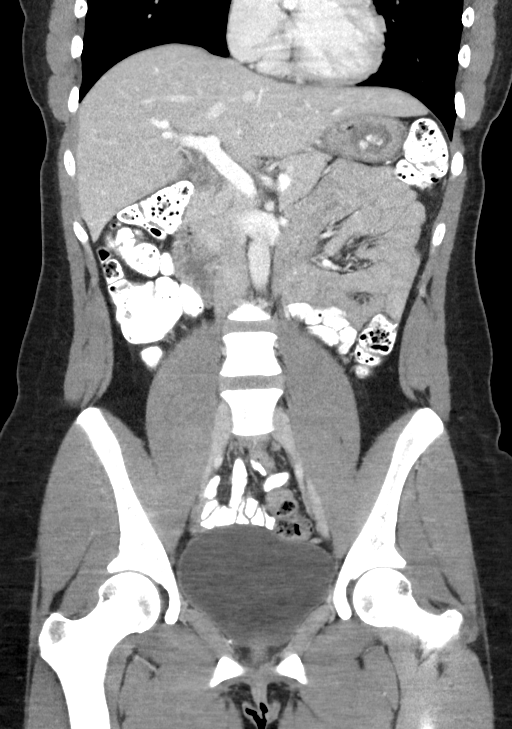
[im 44/79  soft-tissue]
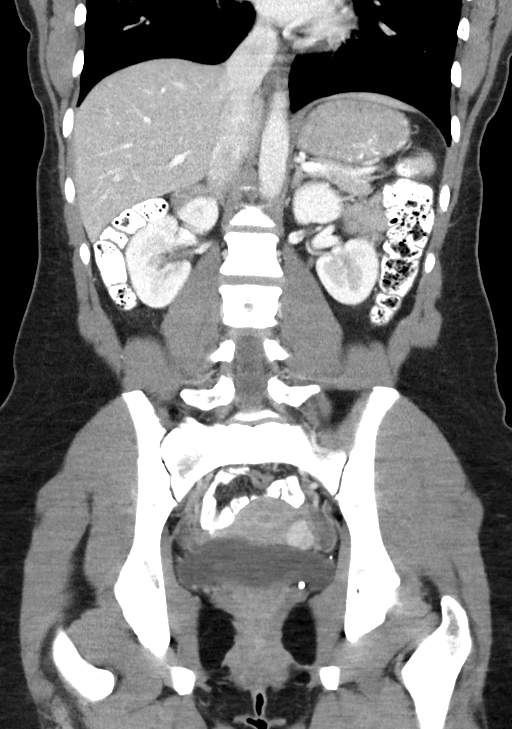

[15 of 46 positions shown; findings below may reference images not displayed]

FINDINGS: Lower chest: Lung bases are clear.

Hepatobiliary: Liver is within normal limits.

Gallbladder is unremarkable. No intrahepatic or extrahepatic ductal
dilatation.

Pancreas: Within normal limits.

Spleen: Within normal limits.

Adrenals/Urinary Tract: Adrenal glands are within normal limits.

Kidneys are within normal limits.  No hydronephrosis.

Bladder is within normal limits.

Stomach/Bowel: Stomach is within normal limits.

No evidence of bowel obstruction.

Normal appendix (series 2/image 59).

Vascular/Lymphatic: No evidence of abdominal aortic aneurysm.

No suspicious abdominopelvic lymphadenopathy.

Reproductive: Uterus and right ovary are within normal limits.

2.1 cm enhancing/hyperdense lesion in the left ovary (series 2/image
76), indeterminate.

Other: No abdominopelvic ascites.

Musculoskeletal: Visualized osseous structures are within normal
limits.
IMPRESSION: Normal appendix.

No CT findings to account for the patient's right lower quadrant
abdominal pain.

2.1 cm lesion in the left ovary, favoring an enhancing solid lesion,
although indeterminate. Pelvic ultrasound is suggested for further
evaluation.

These results will be called to the ordering clinician or
representative by the Radiologist Assistant, and communication
documented in the PACS or [REDACTED].

## 2020-08-06 MED ORDER — SODIUM CHLORIDE (PF) 0.9 % IJ SOLN
INTRAMUSCULAR | Status: AC
  Filled 2020-08-06: qty 50

## 2020-08-06 MED ORDER — IOHEXOL 300 MG/ML  SOLN
100.0000 mL | Freq: Once | INTRAMUSCULAR | Status: AC | PRN
  Administered 2020-08-06: 100 mL via INTRAVENOUS

## 2020-08-06 NOTE — Telephone Encounter (Signed)
Patient is scheduled for the CT abd/pelvis today.  She is asked to continue the dicyclomine and the Miralax and we will call with results when they have been reviewed by Dr. Havery Moros

## 2020-08-09 ENCOUNTER — Telehealth: Payer: Self-pay | Admitting: Gastroenterology

## 2020-08-09 ENCOUNTER — Other Ambulatory Visit: Payer: Self-pay

## 2020-08-09 ENCOUNTER — Telehealth: Payer: Self-pay | Admitting: General Practice

## 2020-08-09 DIAGNOSIS — N839 Noninflammatory disorder of ovary, fallopian tube and broad ligament, unspecified: Secondary | ICD-10-CM

## 2020-08-09 NOTE — Telephone Encounter (Signed)
See results notes for details.  

## 2020-08-09 NOTE — Telephone Encounter (Signed)
Called patient regarding mychart message. Patient states she saw on mychart there is something on her left ovary but she's been having issues on her right side. Patient wants to know if this could be related. Told patient it just depends. Asked if she had a follow up appt with an OB/GYN in the area and she states yes after her ultrasound appt. Told patient she can call us back in the future if she has any desire to transfer care. Patient verbalized understanding.

## 2020-08-10 ENCOUNTER — Ambulatory Visit
Admission: RE | Admit: 2020-08-10 | Discharge: 2020-08-10 | Disposition: A | Discharge status: Home / Self Care | Payer: Medicaid Other | Source: Ambulatory Visit | Attending: Emergency Medicine | Admitting: Emergency Medicine

## 2020-08-10 ENCOUNTER — Other Ambulatory Visit: Payer: Self-pay

## 2020-08-10 VITALS — BP 156/83 | HR 63 | Temp 98.6°F | Resp 18

## 2020-08-10 DIAGNOSIS — R1084 Generalized abdominal pain: Secondary | ICD-10-CM | POA: Insufficient documentation

## 2020-08-10 LAB — POCT URINALYSIS DIP (MANUAL ENTRY)
Bilirubin, UA: NEGATIVE
Blood, UA: NEGATIVE
Glucose, UA: NEGATIVE mg/dL
Ketones, POC UA: NEGATIVE mg/dL
Leukocytes, UA: NEGATIVE
Nitrite, UA: NEGATIVE
Protein Ur, POC: NEGATIVE mg/dL
Spec Grav, UA: 1.015 (ref 1.010–1.025)
Urobilinogen, UA: 0.2 E.U./dL
pH, UA: 7 (ref 5.0–8.0)

## 2020-08-10 MED ORDER — ONDANSETRON 4 MG PO TBDP: 4.0000 mg | ORAL_TABLET | Freq: Three times a day (TID) | ORAL | 0 refills | Status: DC | PRN

## 2020-08-10 NOTE — Discharge Instructions (Addendum)
Urine culture sent.  We will call you with the results.   Push fluids and get plenty of rest.   Zofran tablet was changed to Zofran disintegrating tablet Take as prescribed Follow up with PCP if symptoms persists Return here or go to ER if you have any new or worsening symptoms such as fever, worsening abdominal pain, nausea/vomiting, flank pain, etc..

## 2020-08-10 NOTE — ED Provider Notes (Signed)
[facility]   Chief Complaint  Patient presents with  . Abdominal Pain     SUBJECTIVE:  Janice [name] is a 29 y.o. female presented to the urgent care with a complaint of off-and-on abdominal pain for the past few days.  Has seen OB/GYN and GI and patient reports she has a lesion on her right ovaries.  Has a follow-up appointment set up.  She is currently taking Bentyl and Zofran. Reports she still having bloating and vaginal pressure. Patient denies a precipitating event, recent sexual encounter, excessive caffeine intake.  Localizes pain to her lower abdomen.  Pain is intermittent  And churning in nature .   Denies aggravating or alleviating factors.  Admits to similar symptoms in the past.  Denies fever, chills, flank pain, abnormal vaginal discharge or bleeding, hematuria.    LMP: Patient's last menstrual period was 07/27/2020 (exact date).  ROS: As in HPI.  All other pertinent ROS negative.     Past Medical History:  Diagnosis Date  . Costochondritis   . GERD (gastroesophageal reflux disease)    Past Surgical History:  Procedure Laterality Date  . BREAST SURGERY     No Known Allergies No current facility-administered medications on file prior to encounter.   Current Outpatient Medications on File Prior to Encounter  Medication Sig Dispense Refill  . b complex vitamins capsule Take 1 capsule by mouth daily.    . cetirizine (ZYRTEC) 10 MG tablet Take 10 mg by mouth daily as needed (for seasonal allergies).     Marland Kitchen dicyclomine (BENTYL) 10 MG capsule Take 1 capsule (10 mg total) by mouth every 8 (eight) hours as needed for spasms. 90 capsule 0  . fluticasone (FLONASE) 50 MCG/ACT nasal spray Place 2 sprays into both nostrils daily as needed (for seasonal allergies).     . Multiple Vitamins-Minerals (ONE-A-DAY WOMENS PO) Take 1 tablet by mouth daily with breakfast.     . ondansetron (ZOFRAN) 4 MG tablet Take 1 tablet (4 mg total) by mouth every 8 (eight) hours as  needed for nausea or vomiting. 30 tablet 0  . Probiotic Product (PROBIOTIC DAILY PO) Take by mouth daily.     Social History   Socioeconomic History  . Marital status: Single    Spouse name: Not on file  . Number of children: 0  . Years of education: Not on file  . Highest education level: Master's degree (e.g., MA, MS, MEng, MEd, MSW, MBA)  Occupational History  . Not on file  Tobacco Use  . Smoking status: Never Smoker  . Smokeless tobacco: Never Used  Vaping Use  . Vaping Use: Never used  Substance and Sexual Activity  . Alcohol use: Never  . Drug use: Never  . Sexual activity: Not Currently    Birth control/protection: Pill  Other Topics Concern  . Not on file  Social History Narrative  . Not on file   Social Determinants of Health   Financial Resource Strain:   . Difficulty of Paying Living Expenses: Not on file  Food Insecurity:   . Worried About Charity fundraiser in the Last Year: Not on file  . Ran Out of Food in the Last Year: Not on file  Transportation Needs: No Transportation Needs  . Lack of Transportation (Medical): No  . Lack of Transportation (Non-Medical): No  Physical Activity:   . Days of Exercise per Week: Not on file  . Minutes of Exercise per Session: Not on file  Stress:   .  Feeling of Stress : Not on file  Social Connections:   . Frequency of Communication with Friends and Family: Not on file  . Frequency of Social Gatherings with Friends and Family: Not on file  . Attends Religious Services: Not on file  . Active Member of Clubs or Organizations: Not on file  . Attends Archivist Meetings: Not on file  . Marital Status: Not on file  Intimate Partner Violence:   . Fear of Current or Ex-Partner: Not on file  . Emotionally Abused: Not on file  . Physically Abused: Not on file  . Sexually Abused: Not on file   Family History  Problem Relation Age of Onset  . Hypertension Mother   . Hyperlipidemia Father   . Hypertension  Sister     OBJECTIVE:  Vitals:   08/10/20 0948  BP: (!) 156/83  Pulse: 63  Resp: 18  Temp: 98.6 F (37 C)  SpO2: 99%   General appearance: AOx3 in no acute distress HEENT: NCAT.  Oropharynx clear.  Lungs: clear to auscultation bilaterally without adventitious breath sounds Heart: regular rate and rhythm.  Radial pulses 2+ symmetrical bilaterally Abdomen: soft; non-distended; no tenderness; bowel sounds present; no guarding or rebound tenderness Back: no CVA tenderness Extremities: no edema; symmetrical with no gross deformities Skin: warm and dry Neurologic: Ambulates from chair to exam table without difficulty Psychological: alert and cooperative; normal mood and affect  Labs Reviewed  URINE CULTURE  POCT URINALYSIS DIP (MANUAL ENTRY)    ASSESSMENT & PLAN:  1. Generalized abdominal pain     Meds ordered this encounter  Medications  . ondansetron (ZOFRAN ODT) 4 MG disintegrating tablet    Sig: Take 1 tablet (4 mg total) by mouth every 8 (eight) hours as needed for nausea or vomiting.    Dispense:  20 tablet    Refill:  0   Discharge structures Urine culture sent.  We will call you with the results.   Push fluids and get plenty of rest.   Zofran tablet was changed to Zofran disintegrating tablet Take as prescribed Follow up with PCP if symptoms persists Return here or go to ER if you have any new or worsening symptoms such as fever, worsening abdominal pain, nausea/vomiting, flank pain, etc...  Outlined signs and symptoms indicating need for more acute intervention. Patient verbalized understanding. After Visit Summary given.  Note: This document was prepared using Dragon voice recognition software and may include unintentional dictation errors.Emerson Monte, FNP 08/10/20 1055

## 2020-08-10 NOTE — ED Triage Notes (Signed)
Pt presents with complaints of ongoing abdominal pain. Reports this has been ongoing for a month or so. Reports she saw sports medicine and diagnosed with "inflammation in the chest". The sports medicine doctor said he was concerned it was possible GI issue. She saw a GI doctor that put her on a special diet. Reports she was having bloating and felt it was triggered by spicy foods while doing the special diet. She then started having a "twingy" feeling for 2 weeks that is worse on her right side. She had a CT scan that showed a lesion on her ovary and is being seen for that on Friday. She has had some constipation and has been taking miralax for that.   Reports she is here today for the continued "churning" abdominal pain that is worse at night with nausea. She has been taking Bentyl and Zofran for the symptoms with minimal relief. States the pain that was on her right side is now on her left side as well. States she has been having difficulty sleeping as well.

## 2020-08-11 ENCOUNTER — Other Ambulatory Visit: Payer: Medicaid Other

## 2020-08-11 ENCOUNTER — Telehealth: Payer: Self-pay | Admitting: Gastroenterology

## 2020-08-11 NOTE — Telephone Encounter (Signed)
Pt called regarding the previous Mychart message that she sent today to Dr. Havery Moros. She is requesting a call back from Dr. Havery Moros about that message.

## 2020-08-11 NOTE — Telephone Encounter (Signed)
Called patient, I just saw her message. She is having abnormal vaginal bleeding and pelvic pain / cramps. She is seeing her GYN office in one hour. I agree that is where she needs to be seen for this particular issue. If they end up doing a pelvic US  in their office she can cancel the one that was coordinated by or office for Friday, she will let us know.

## 2020-08-11 NOTE — Telephone Encounter (Signed)
Dr. Havery Moros, please call patient

## 2020-08-12 LAB — URINE CULTURE: Culture: NO GROWTH

## 2020-08-12 NOTE — Telephone Encounter (Signed)
Pt is requesting a call back from Sheri 

## 2020-08-12 NOTE — Telephone Encounter (Signed)
Left message for patient to call back  

## 2020-08-12 NOTE — Telephone Encounter (Signed)
Janice [name] can you or one of the staff call her to get her update if possible. She had abnormal vaginal bleeding and pelvic pain yesterday, was told to see GYN. Thanks

## 2020-08-13 ENCOUNTER — Ambulatory Visit (HOSPITAL_COMMUNITY): Payer: Medicaid Other

## 2020-08-13 NOTE — Telephone Encounter (Signed)
Okay; thanks.

## 2020-08-13 NOTE — Telephone Encounter (Signed)
Patient had [place] at the GYN.  She has a left ovarian cyst.  She has a lesion near the fallopian tube on the left.  Lesion is 2.7 cm.  She will see GYN again on Monday in follow up and will get plan for workup of lesion

## 2020-08-19 ENCOUNTER — Encounter: Payer: Medicaid Other | Admitting: Medical

## 2020-08-20 DIAGNOSIS — R109 Unspecified abdominal pain: Secondary | ICD-10-CM

## 2020-08-20 DIAGNOSIS — R140 Abdominal distension (gaseous): Secondary | ICD-10-CM

## 2020-08-26 ENCOUNTER — Telehealth: Payer: Self-pay | Admitting: Gastroenterology

## 2020-08-26 NOTE — Telephone Encounter (Signed)
Returned call to patient, phone rings then fast busy signal, unable to leave voicemail. Patient message from today has been forwarded to Dr. Havery Moros for review.

## 2020-08-27 ENCOUNTER — Other Ambulatory Visit: Payer: Self-pay

## 2020-08-27 ENCOUNTER — Other Ambulatory Visit: Payer: Medicaid Other

## 2020-08-27 DIAGNOSIS — Z20822 Contact with and (suspected) exposure to covid-19: Secondary | ICD-10-CM

## 2020-08-27 DIAGNOSIS — K602 Anal fissure, unspecified: Secondary | ICD-10-CM

## 2020-08-27 MED ORDER — AMBULATORY NON FORMULARY MEDICATION
1 refills | Status: AC
Start: 2020-08-27 — End: ?

## 2020-08-27 NOTE — Telephone Encounter (Signed)
Spoke with patient, she is aware that RX for nitroglycerin ointment has been called into [facility]. Pt is scheduled for a pre-visit appt on 08/30/20 at 1:30 pm, propofol colon on 09/06/20 at 7:30 am, with a 6:30 am arrival time. Pt is fully vaccinated, No BT. Answered all of patient's questions.  Ambulatory referral in epic.

## 2020-08-30 ENCOUNTER — Other Ambulatory Visit (INDEPENDENT_AMBULATORY_CARE_PROVIDER_SITE_OTHER): Payer: Self-pay

## 2020-08-30 ENCOUNTER — Ambulatory Visit (AMBULATORY_SURGERY_CENTER): Payer: Self-pay | Admitting: *Deleted

## 2020-08-30 ENCOUNTER — Other Ambulatory Visit: Payer: Self-pay

## 2020-08-30 VITALS — Ht 68.0 in | Wt 160.0 lb

## 2020-08-30 DIAGNOSIS — R109 Unspecified abdominal pain: Secondary | ICD-10-CM

## 2020-08-30 DIAGNOSIS — K625 Hemorrhage of anus and rectum: Secondary | ICD-10-CM

## 2020-08-30 LAB — CBC WITH DIFFERENTIAL/PLATELET
Basophils Absolute: 0 10*3/uL (ref 0.0–0.1)
Basophils Relative: 0.7 % (ref 0.0–3.0)
Eosinophils Absolute: 0 10*3/uL (ref 0.0–0.7)
Eosinophils Relative: 0.4 % (ref 0.0–5.0)
HCT: 38.8 % (ref 36.0–46.0)
Hemoglobin: 13.1 g/dL (ref 12.0–15.0)
Lymphocytes Relative: 34.6 % (ref 12.0–46.0)
Lymphs Abs: 2.4 10*3/uL (ref 0.7–4.0)
MCHC: 33.8 g/dL (ref 30.0–36.0)
MCV: 92.4 fl (ref 78.0–100.0)
Monocytes Absolute: 0.5 10*3/uL (ref 0.1–1.0)
Monocytes Relative: 7.1 % (ref 3.0–12.0)
Neutro Abs: 4 10*3/uL (ref 1.4–7.7)
Neutrophils Relative %: 57.2 % (ref 43.0–77.0)
Platelets: 206 10*3/uL (ref 150.0–400.0)
RBC: 4.19 Mil/uL (ref 3.87–5.11)
RDW: 12.6 % (ref 11.5–15.5)
WBC: 7.1 10*3/uL (ref 4.0–10.5)

## 2020-08-30 LAB — COMPREHENSIVE METABOLIC PANEL
ALT: 12 U/L (ref 0–35)
AST: 15 U/L (ref 0–37)
Albumin: 4.8 g/dL (ref 3.5–5.2)
Alkaline Phosphatase: 57 U/L (ref 39–117)
BUN: 7 mg/dL (ref 6–23)
CO2: 26 mEq/L (ref 19–32)
Calcium: 9.8 mg/dL (ref 8.4–10.5)
Chloride: 104 mEq/L (ref 96–112)
Creatinine, Ser: 0.9 mg/dL (ref 0.40–1.20)
GFR: 88.93 mL/min (ref >60.00)
Glucose, Bld: 82 mg/dL (ref 70–99)
Potassium: 4 mEq/L (ref 3.5–5.1)
Sodium: 137 mEq/L (ref 135–145)
Total Bilirubin: 0.4 mg/dL (ref 0.2–1.2)
Total Protein: 7.7 g/dL (ref 6.0–8.3)

## 2020-08-30 LAB — NOVEL CORONAVIRUS, NAA: SARS-CoV-2, NAA: NOT DETECTED

## 2020-08-30 MED ORDER — NA SULFATE-K SULFATE-MG SULF 17.5-3.13-1.6 GM/177ML PO SOLN: ORAL | 0 refills | Status: DC

## 2020-08-30 NOTE — Telephone Encounter (Signed)
Spoke with patient, see patient message from 08/20/20 more information.

## 2020-08-30 NOTE — Telephone Encounter (Signed)
Spoke with patient, she stated that she was currently at our office for pre-visit appt, advised patient to give me a call back once she was finished.

## 2020-08-30 NOTE — Telephone Encounter (Signed)
Spoke with patient she reports RLQ pain has gotten worse - she states this is similar to how she felt prior to her CT scan, almost feels like it is inflamed, states that pain is going down to pelvic area, can almost feel it when she is walking, extremely bloated, nauseas, only been eating mashed potatoes for the last couple of days, feels full quickly, only had part of a smoothie today. Advised patient to keep appt for colonoscopy on 09/06/20, she is aware that the entire colon will be looked at during this time, advised that if biopsies are taken they will be sent to the lab, advised that Dr. Havery Moros will discuss findings after procedure and she will also receive a procedure report with next steps. Pt states that she has a prescription for Zofran, advised to take as directed for nausea. Advised patient to follow up with PCP regarding frequent urination, she states that her PCP tells her to follow up with GYN and Gastro, she states that she has had a U/A and it was negative for a UTI. Pt is just concerned about symptoms she states that all of her symptoms seemed to start a couple of weeks ago out of no where. Advised that I would give her a call back if Dr. Havery Moros had any further recommendations. Please advise, thank you

## 2020-08-30 NOTE — Progress Notes (Signed)
Patient is here in-person for PV. Patient denies any allergies to eggs or soy. Patient denies any problems with anesthesia/sedation. Patient denies any oxygen use at home. Patient denies taking any diet/weight loss medications or blood thinners. Patient is not being treated for MRSA or C-diff. Patient is aware of our care-partner policy and [id number] safety protocol. EMMI education assisgned to the patient for the procedure, sent to MyChart.   COVID-19 vaccines completed on , march 2021, per patient.

## 2020-08-30 NOTE — Telephone Encounter (Signed)
Patient requesting to speak with you again

## 2020-08-30 NOTE — Telephone Encounter (Signed)
Spoke with patient, she is aware of Dr. Doyne Keel recommendations. She is aware that I have sent recommendations via My Chart as well.

## 2020-09-03 ENCOUNTER — Encounter: Payer: Medicaid Other | Admitting: Medical

## 2020-09-05 ENCOUNTER — Encounter: Payer: Self-pay | Admitting: Certified Registered Nurse Anesthetist

## 2020-09-06 ENCOUNTER — Ambulatory Visit (AMBULATORY_SURGERY_CENTER): Payer: Self-pay | Admitting: Gastroenterology

## 2020-09-06 ENCOUNTER — Other Ambulatory Visit: Payer: Self-pay

## 2020-09-06 ENCOUNTER — Encounter: Payer: Self-pay | Admitting: Gastroenterology

## 2020-09-06 VITALS — BP 100/67 | HR 65 | Temp 97.5°F | Resp 17 | Ht 68.0 in | Wt 160.0 lb

## 2020-09-06 DIAGNOSIS — K635 Polyp of colon: Secondary | ICD-10-CM

## 2020-09-06 DIAGNOSIS — K602 Anal fissure, unspecified: Secondary | ICD-10-CM

## 2020-09-06 DIAGNOSIS — D123 Benign neoplasm of transverse colon: Secondary | ICD-10-CM

## 2020-09-06 DIAGNOSIS — K625 Hemorrhage of anus and rectum: Secondary | ICD-10-CM

## 2020-09-06 DIAGNOSIS — R1030 Lower abdominal pain, unspecified: Secondary | ICD-10-CM

## 2020-09-06 MED ORDER — SODIUM CHLORIDE 0.9 % IV SOLN: 500.0000 mL | INTRAVENOUS | Status: DC

## 2020-09-06 NOTE — Progress Notes (Signed)
Report given to PACU, vss 

## 2020-09-06 NOTE — Progress Notes (Signed)
Called to room to assist during endoscopic procedure.  Patient ID and intended procedure confirmed with present staff. Received instructions for my participation in the procedure from the performing physician.  

## 2020-09-06 NOTE — Op Note (Signed)
[name] Procedure Date: 09/06/2020 7:11 AM MRN: [id number] Endoscopist: Remo Lipps P. Havery Moros , MD Age: 29 Referring MD:  Date of Birth: 1991-06-01 Gender: Female Account #: [id number] Procedure:                Colonoscopy Indications:              Lower abdominal pain, Rectal bleeding, vaginal                            bleeding - followed by GYN for ovarian lesion Medicines:                Monitored Anesthesia Care Procedure:                Pre-Anesthesia Assessment:                           - Prior to the procedure, a History and Physical                            was performed, and patient medications and                            allergies were reviewed. The patient's tolerance of                            previous anesthesia was also reviewed. The risks                            and benefits of the procedure and the sedation                            options and risks were discussed with the patient.                            All questions were answered, and informed consent                            was obtained. Prior Anticoagulants: The patient has                            taken no previous anticoagulant or antiplatelet                            agents. ASA Grade Assessment: II - A patient with                            mild systemic disease. After reviewing the risks                            and benefits, the patient was deemed in                            satisfactory condition to undergo the procedure.  After obtaining informed consent, the colonoscope                            was passed under direct vision. Throughout the                            procedure, the patient's blood pressure, pulse, and                            oxygen saturations were monitored continuously. The                            Colonoscope was introduced through the anus and                            advanced to the  the terminal ileum, with                            identification of the appendiceal orifice and IC                            valve. The colonoscopy was performed without                            difficulty. The patient tolerated the procedure                            well. The quality of the bowel preparation was                            good. The terminal ileum, ileocecal valve,                            appendiceal orifice, and rectum were photographed. Scope In: 7:37:11 AM Scope Out: 8:02:24 AM Scope Withdrawal Time: 0 hours 21 minutes 25 seconds  Total Procedure Duration: 0 hours 25 minutes 13 seconds  Findings:                 An small anal fissure was found on perianal exam in                            the posterior midline anal canal.                           The terminal ileum appeared normal.                           A 4 mm polyp was found in the transverse colon. The                            polyp was flat. The polyp was removed with a cold                            snare. Resection and retrieval  were complete.                           The colon was tortuous.                           The exam was otherwise without abnormality. No                            luminal cause for bleeding appreciated. Complications:            No immediate complications. Estimated blood loss:                            Minimal. Estimated Blood Loss:     Estimated blood loss was minimal. Impression:               - Anal fissure found on perianal exam.                           - The examined portion of the ileum was normal.                           - One 4 mm polyp in the transverse colon, removed                            with a cold snare. Resected and retrieved.                           - Tortuous colon.                           - The examination was otherwise normal.                           Fissure is the likely cause for bleeding with some                             superificial skin breakdown in the area. No luminal                            evidence of IBD / concerning pathology regarding                            bleeding or pain, which could be related to GYN                            etiologies. Recommendation:           - Patient has a contact number available for                            emergencies. The signs and symptoms of potential                            delayed complications were discussed with the  patient. Return to normal activities tomorrow.                            Written discharge instructions were provided to the                            patient.                           - Resume previous diet.                           - Continue present medications including topical                            nitroglycerin ointment for fissure. Could consider                            trial of barrier cream as well                           - Await pathology results.                           - Follow up with gynecology regarding ovarian                            lesion, unclear if that is related to pain vs.                            endometriosis, other, etc Remo Lipps P. Seleta Hovland, MD 09/06/2020 8:15:30 AM This report has been signed electronically.

## 2020-09-06 NOTE — Progress Notes (Signed)
Pt's states no medical or surgical changes since previsit or office visit. VS by SB

## 2020-09-06 NOTE — Patient Instructions (Signed)
Handout provided on polyps.   Continue present medications including topical nitroglycerin ointment for fissure-use inside the rectum. Could consider trial of barrier cream (for example diaper rash cream) as well.   Follow-up with gynecology regarding ovarian lesion, unclear if that is related to pain vs. Endometriosis, other, etc.   YOU HAD AN ENDOSCOPIC PROCEDURE TODAY AT [facility]:   Refer to the procedure report that was given to you for any specific questions about what was found during the examination.  If the procedure report does not answer your questions, please call your gastroenterologist to clarify.  If you requested that your care partner not be given the details of your procedure findings, then the procedure report has been included in a sealed envelope for you to review at your convenience later.  YOU SHOULD EXPECT: Some feelings of bloating in the abdomen. Passage of more gas than usual.  Walking can help get rid of the air that was put into your GI tract during the procedure and reduce the bloating. If you had a lower endoscopy (such as a colonoscopy or flexible sigmoidoscopy) you may notice spotting of blood in your stool or on the toilet paper. If you underwent a bowel prep for your procedure, you may not have a normal bowel movement for a few days.  Please Note:  You might notice some irritation and congestion in your nose or some drainage.  This is from the oxygen used during your procedure.  There is no need for concern and it should clear up in a day or so.  SYMPTOMS TO REPORT IMMEDIATELY:   Following lower endoscopy (colonoscopy or flexible sigmoidoscopy):  Excessive amounts of blood in the stool  Significant tenderness or worsening of abdominal pains  Swelling of the abdomen that is new, acute  Fever of 100F or higher  For urgent or emergent issues, a gastroenterologist can be reached at any hour by calling [phone]. Do not use MyChart  messaging for urgent concerns.    DIET:  We do recommend a small meal at first, but then you may proceed to your regular diet.  Drink plenty of fluids but you should avoid alcoholic beverages for 24 hours.  ACTIVITY:  You should plan to take it easy for the rest of today and you should NOT DRIVE or use heavy machinery until tomorrow (because of the sedation medicines used during the test).    FOLLOW UP: Our staff will call the number listed on your records 48-72 hours following your procedure to check on you and address any questions or concerns that you may have regarding the information given to you following your procedure. If we do not reach you, we will leave a message.  We will attempt to reach you two times.  During this call, we will ask if you have developed any symptoms of COVID 19. If you develop any symptoms (ie: fever, flu-like symptoms, shortness of breath, cough etc.) before then, please call [phone].  If you test positive for Covid 19 in the 2 weeks post procedure, please call and report this information to [place].    If any biopsies were taken you will be contacted by phone or by letter within the next 1-3 weeks.  Please call us at [phone] if you have not heard about the biopsies in 3 weeks.    SIGNATURES/CONFIDENTIALITY: You and/or your care partner have signed paperwork which will be entered into your electronic medical record.  These signatures attest to the  fact that that the information above on your After Visit Summary has been reviewed and is understood.  Full responsibility of the confidentiality of this discharge information lies with you and/or your care-partner.

## 2020-09-08 ENCOUNTER — Telehealth: Payer: Self-pay | Admitting: *Deleted

## 2020-09-08 NOTE — Telephone Encounter (Signed)
°  Follow up Call-  Call back number 09/06/2020  Post procedure Call Back phone  # [phone]  Permission to leave phone message Yes  Some recent data might be hidden     Patient questions:  Do you have a fever, pain , or abdominal swelling? No. Pain Score  0 *  Have you tolerated food without any problems? Yes.    Have you been able to return to your normal activities? Yes.    Do you have any questions about your discharge instructions: Diet   No. Medications  No. Follow up visit  No.  Do you have questions or concerns about your Care? No.  Actions: * If pain score is 4 or above: 1. No action needed, pain <4.Have you developed a fever since your procedure? no  2.   Have you had an respiratory symptoms (SOB or cough) since your procedure? no  3.   Have you tested positive for COVID 19 since your procedure no  4.   Have you had any family members/close contacts diagnosed with the COVID 19 since your procedure?  no   If yes to any of these questions please route to Joylene John, RN and Joella Prince, RN

## 2020-09-15 ENCOUNTER — Telehealth: Payer: Self-pay | Admitting: Gastroenterology

## 2020-09-15 NOTE — Telephone Encounter (Signed)
I called the patient back per her request she had several questions.  She inquired about how much MiraLAX to take for her bowel habits.  It is helping however she does not think she needs to take it every day.  She can use this as needed moving forward.  She inquires about how much omeprazole to take.  She was taking this as part of her work-up when she was having atypical chest pain and suspected costochondritis.  Her chest been feeling better.  I recommend she take this every other day and titrate down if she does okay with it.  If she has recurrence of symptoms as she comes off she can resume taking it daily.  She asked about her bloating and if she has irritable bowel syndrome.  Her colonoscopy looked okay, I reassured her that the polyps removed from her colon is benign.  We discussed intestinal gas and bloating.  She is doing the low FODMAP diet which has helped.  Gas-X did not help much.  She has a follow-up with her gynecologist regarding her ovarian lesion, I am not sure what to make of that and if it warrants further evaluation, she has a repeat ultrasound pending.  Some of her symptoms are worse around her menstrual cycle, I am not sure if that is contributing or if she has potentially underlying endometriosis and defer to her gynecologist on that.  She can touch base with me after she has seen the gynecologist for an update. I otherwise reassured her that she does not have Crohn's disease or colitis based on her recent colonoscopy and that her CT scan otherwise was reassuring.  All questions answered.  13 minutes spent on the phone.

## 2020-09-29 ENCOUNTER — Ambulatory Visit: Payer: Medicaid Other | Admitting: Gastroenterology

## 2021-06-19 ENCOUNTER — Ambulatory Visit: Payer: Medicaid Other

## 2021-07-08 ENCOUNTER — Ambulatory Visit
Admission: EM | Admit: 2021-07-08 | Discharge: 2021-07-08 | Disposition: A | Discharge status: Home / Self Care | Payer: BC Managed Care – PPO | Attending: Emergency Medicine | Admitting: Emergency Medicine

## 2021-07-08 ENCOUNTER — Encounter: Payer: Self-pay | Admitting: Emergency Medicine

## 2021-07-08 ENCOUNTER — Other Ambulatory Visit: Payer: Self-pay

## 2021-07-08 DIAGNOSIS — R1084 Generalized abdominal pain: Secondary | ICD-10-CM

## 2021-07-08 DIAGNOSIS — R110 Nausea: Secondary | ICD-10-CM | POA: Diagnosis present

## 2021-07-08 LAB — POCT URINALYSIS DIP (MANUAL ENTRY)
Bilirubin, UA: NEGATIVE
Blood, UA: NEGATIVE
Glucose, UA: NEGATIVE mg/dL
Ketones, POC UA: NEGATIVE mg/dL
Leukocytes, UA: NEGATIVE
Nitrite, UA: NEGATIVE
Protein Ur, POC: NEGATIVE mg/dL
Spec Grav, UA: 1.01 (ref 1.010–1.025)
Urobilinogen, UA: 0.2 E.U./dL
pH, UA: 6.5 (ref 5.0–8.0)

## 2021-07-08 LAB — POCT URINE PREGNANCY: Preg Test, Ur: NEGATIVE

## 2021-07-08 MED ORDER — ALUM & MAG HYDROXIDE-SIMETH 200-200-20 MG/5ML PO SUSP
30.0000 mL | Freq: Once | ORAL | Status: AC
  Administered 2021-07-08: 30 mL via ORAL

## 2021-07-08 MED ORDER — DICYCLOMINE HCL 20 MG PO TABS: 20.0000 mg | ORAL_TABLET | Freq: Two times a day (BID) | ORAL | 0 refills | Status: AC

## 2021-07-08 MED ORDER — LIDOCAINE VISCOUS HCL 2 % MT SOLN
15.0000 mL | Freq: Once | OROMUCOSAL | Status: AC
  Administered 2021-07-08: 15 mL via ORAL

## 2021-07-08 MED ORDER — ONDANSETRON HCL 4 MG PO TABS: 4.0000 mg | ORAL_TABLET | Freq: Four times a day (QID) | ORAL | 0 refills | Status: AC

## 2021-07-08 NOTE — ED Triage Notes (Signed)
History of covid last week

## 2021-07-08 NOTE — Discharge Instructions (Addendum)
Unable to determine cause of abdominal pain in urgent care setting.  Offered patient further evaluation and management in the ED.  Patient declines at this time and would like to try outpatient therapy first.  Aware of the risk associated with this decision including missed diagnosis, organ damage, organ failure, and/or death.  Patient aware and in agreement.     Urine pregnancy negative Urine without signs of infection GI cocktail given in office In the meantime stick to a bland/ high fiber diet.  Stay away from greasy, fried, or fatty foods as this may make your symptoms worse You may try incorporating OTC miralax.   Zofran for nausea Bentyl prescribed for abdominal cramping.  Use as directed Follow up with GI for further evaluation and management If you experience new or worsening symptoms return or go to ER such as fever, chills, nausea, vomiting, fatigue, lightheadedness, dizziness, profuse watery diarrhea, bloody or dark tarry stools, constipation, urinary symptoms, worsening abdominal discomfort, symptoms that do not improve with medications, inability to keep fluids down, etc..Marland Kitchen

## 2021-07-08 NOTE — ED Triage Notes (Signed)
Upper abd cramps over the past few weeks.  Now having lower abd pain.  Nauseated states she thinks she is constipated.  States she has a history of GERD.  States she feels gassy with rectal pressure.

## 2021-07-08 NOTE — ED Provider Notes (Signed)
[facility]   [id number] 07/08/21 Arrival Time: [id number]  CC: ABDOMINAL DISCOMFORT  SUBJECTIVE:  Janice [name] is a 30 y.o. female who presents with complaint of abdominal discomfort that began few days ago.  Denies a precipitating event, trauma, close contacts with similar symptoms, recent travel or antibiotic use.  Reports recent COVID infection.  Localizes pain to diffuse.  Describes as intermittent, worsening and "churning" in character.  Has tried OTC medications without relief.  Denies alleviating factors.  Complains of worsening symptoms at night.  Reports similar symptoms in the past with fibroid.  Reports associated fullness, gassy, nausea, urinary frequency, rectal pressure.    Denies fever, chills, vomiting, chest pain, SOB, diarrhea, constipation, hematochezia, melena, dysuria, difficulty urinating, flank pain, loss of bowel or bladder function, vaginal discharge, vaginal odor, vaginal bleeding, dyspareunia, pelvic pain.     Patient's last menstrual period was 06/23/2021.  ROS: As per HPI.  All other pertinent ROS negative.     Past Medical History:  Diagnosis Date   Costochondritis    GERD (gastroesophageal reflux disease)    IBS (irritable bowel syndrome)    Ovarian mass, left 07/2020   CT scan   Past Surgical History:  Procedure Laterality Date   BREAST SURGERY     aspiration of cyst    No Known Allergies No current facility-administered medications on file prior to encounter.   Current Outpatient Medications on File Prior to Encounter  Medication Sig Dispense Refill   AMBULATORY NON FORMULARY MEDICATION 0.125 % topical nitroglycerin ointment, apply pea-sized amount 3 times daily until symptoms resolve 30 g 1   b complex vitamins capsule Take 1 capsule by mouth daily.     cetirizine (ZYRTEC) 10 MG tablet Take 10 mg by mouth daily as needed (for seasonal allergies).      fluticasone (FLONASE) 50 MCG/ACT nasal spray Place 2 sprays into both nostrils daily  as needed (for seasonal allergies).      Probiotic Product (PROBIOTIC DAILY PO) Take by mouth daily.     Social History   Socioeconomic History   Marital status: Single    Spouse name: Not on file   Number of children: 0   Years of education: Not on file   Highest education level: Master's degree (e.g., MA, MS, MEng, MEd, MSW, MBA)  Occupational History   Not on file  Tobacco Use   Smoking status: Never   Smokeless tobacco: Never  Vaping Use   Vaping Use: Never used  Substance and Sexual Activity   Alcohol use: Never   Drug use: Never   Sexual activity: Not Currently    Birth control/protection: Pill  Other Topics Concern   Not on file  Social History Narrative   Not on file   Social Determinants of Health   Financial Resource Strain: Not on file  Food Insecurity: Not on file  Transportation Needs: Not on file  Physical Activity: Not on file  Stress: Not on file  Social Connections: Not on file  Intimate Partner Violence: Not on file   Family History  Problem Relation Age of Onset   Hypertension Mother    Colon polyps Mother    Hyperlipidemia Father    Lung disease Father    Hypertension Sister    Colon cancer Neg Hx    Esophageal cancer Neg Hx    Rectal cancer Neg Hx    Stomach cancer Neg Hx      OBJECTIVE:  Vitals:   07/08/21 1353  BP: 136/87  Pulse: 70  Resp: 14  Temp: 97.6 F (36.4 C)  TempSrc: Tympanic  SpO2: 95%    General appearance: Alert; NAD HEENT: NCAT.  Oropharynx clear.  Lungs: clear to auscultation bilaterally without adventitious breath sounds; difficult to assess due to patient talking Heart: regular rate and rhythm.   Abdomen: soft, non-distended; normal active bowel sounds; mild diffuse TTP over abdomen; negative Murphy's sign; negative rebound; no guarding Back: no CVA tenderness Extremities: no edema; symmetrical with no gross deformities Skin: warm and dry Neurologic: normal gait Psychological: alert and cooperative;  anxious mood and affect  LABS:   Results for orders placed or performed during the hospital encounter of 07/08/21 (from the past 24 hour(s))  POCT urine pregnancy     Status: None   Collection Time: 07/08/21  2:40 PM  Result Value Ref Range   Preg Test, Ur Negative Negative  POCT urinalysis dipstick     Status: None   Collection Time: 07/08/21  2:40 PM  Result Value Ref Range   Color, UA yellow yellow   Clarity, UA clear clear   Glucose, UA negative negative mg/dL   Bilirubin, UA negative negative   Ketones, POC UA negative negative mg/dL   Spec Grav, UA 1.010 1.010 - 1.025   Blood, UA negative negative   pH, UA 6.5 5.0 - 8.0   Protein Ur, POC negative negative mg/dL   Urobilinogen, UA 0.2 0.2 or 1.0 E.U./dL   Nitrite, UA Negative Negative   Leukocytes, UA Negative Negative     ASSESSMENT & PLAN:  1. Generalized abdominal pain   2. Nausea without vomiting     Meds ordered this encounter  Medications   AND Linked Order Group    alum & mag hydroxide-simeth (MAALOX/MYLANTA) 200-200-20 MG/5ML suspension 30 mL    lidocaine (XYLOCAINE) 2 % viscous mouth solution 15 mL   ondansetron (ZOFRAN) 4 MG tablet    Sig: Take 1 tablet (4 mg total) by mouth every 6 (six) hours.    Dispense:  12 tablet    Refill:  0    Order Specific Question:   Supervising Provider    Answer:   Raylene Everts [id number]   dicyclomine (BENTYL) 20 MG tablet    Sig: Take 1 tablet (20 mg total) by mouth 2 (two) times daily.    Dispense:  20 tablet    Refill:  0    Order Specific Question:   Supervising Provider    Answer:   Raylene Everts [id number]   Unable to determine cause of abdominal pain in urgent care setting.  Offered patient further evaluation and management in the ED.  Patient declines at this time and would like to try outpatient therapy first.  Aware of the risk associated with this decision including missed diagnosis, organ damage, organ failure, and/or death.  Patient aware and in  agreement.     Urine pregnancy negative Urine without signs of infection GI cocktail given in office In the meantime stick to a bland/ high fiber diet.  Stay away from greasy, fried, or fatty foods as this may make your symptoms worse You may try incorporating OTC miralax.   Zofran for nausea Bentyl prescribed for abdominal cramping.  Use as directed Follow up with GI for further evaluation and management If you experience new or worsening symptoms return or go to ER such as fever, chills, nausea, vomiting, fatigue, lightheadedness, dizziness, profuse watery diarrhea, bloody or dark tarry stools, constipation, urinary symptoms, worsening abdominal discomfort, symptoms  that do not improve with medications, inability to keep fluids down, etc...   Reviewed expectations re: course of current medical issues. Questions answered. Outlined signs and symptoms indicating need for more acute intervention. Patient verbalized understanding. After Visit Summary given.   Lestine Box, PA-C 07/08/21 1448

## 2021-07-10 LAB — URINE CULTURE: Culture: NO GROWTH

## 2022-01-05 DIAGNOSIS — Z23 Encounter for immunization: Secondary | ICD-10-CM | POA: Diagnosis not present

## 2022-01-05 DIAGNOSIS — N83201 Unspecified ovarian cyst, right side: Secondary | ICD-10-CM | POA: Diagnosis not present

## 2022-01-05 DIAGNOSIS — E0590 Thyrotoxicosis, unspecified without thyrotoxic crisis or storm: Secondary | ICD-10-CM | POA: Diagnosis not present

## 2022-01-09 DIAGNOSIS — E0590 Thyrotoxicosis, unspecified without thyrotoxic crisis or storm: Secondary | ICD-10-CM | POA: Diagnosis not present

## 2022-01-09 DIAGNOSIS — N83201 Unspecified ovarian cyst, right side: Secondary | ICD-10-CM | POA: Diagnosis not present

## 2022-01-10 ENCOUNTER — Other Ambulatory Visit: Payer: Self-pay

## 2022-01-10 ENCOUNTER — Emergency Department (HOSPITAL_COMMUNITY)
Admission: EM | Admit: 2022-01-10 | Discharge: 2022-01-11 | Disposition: A | Discharge status: Home / Self Care | Payer: 59 | Attending: Emergency Medicine | Admitting: Emergency Medicine

## 2022-01-10 ENCOUNTER — Encounter (HOSPITAL_COMMUNITY): Payer: Self-pay | Admitting: Emergency Medicine

## 2022-01-10 DIAGNOSIS — R110 Nausea: Secondary | ICD-10-CM | POA: Diagnosis not present

## 2022-01-10 DIAGNOSIS — R1031 Right lower quadrant pain: Secondary | ICD-10-CM | POA: Insufficient documentation

## 2022-01-10 DIAGNOSIS — D259 Leiomyoma of uterus, unspecified: Secondary | ICD-10-CM | POA: Diagnosis not present

## 2022-01-10 DIAGNOSIS — R102 Pelvic and perineal pain: Secondary | ICD-10-CM | POA: Diagnosis not present

## 2022-01-10 LAB — CBC WITH DIFFERENTIAL/PLATELET
Abs Immature Granulocytes: 0.02 10*3/uL (ref 0.00–0.07)
Basophils Absolute: 0.1 10*3/uL (ref 0.0–0.1)
Basophils Relative: 1 %
Eosinophils Absolute: 0.1 10*3/uL (ref 0.0–0.5)
Eosinophils Relative: 1 %
HCT: 39.4 % (ref 36.0–46.0)
Hemoglobin: 13.4 g/dL (ref 12.0–15.0)
Immature Granulocytes: 0 %
Lymphocytes Relative: 31 %
Lymphs Abs: 3 10*3/uL (ref 0.7–4.0)
MCH: 31.2 pg (ref 26.0–34.0)
MCHC: 34 g/dL (ref 30.0–36.0)
MCV: 91.8 fL (ref 80.0–100.0)
Monocytes Absolute: 0.6 10*3/uL (ref 0.1–1.0)
Monocytes Relative: 6 %
Neutro Abs: 5.8 10*3/uL (ref 1.7–7.7)
Neutrophils Relative %: 61 %
Platelets: 222 10*3/uL (ref 150–400)
RBC: 4.29 MIL/uL (ref 3.87–5.11)
RDW: 12.5 % (ref 11.5–15.5)
WBC: 9.5 10*3/uL (ref 4.0–10.5)
nRBC: 0 % (ref 0.0–0.2)

## 2022-01-10 LAB — COMPREHENSIVE METABOLIC PANEL
ALT: 24 U/L (ref 0–44)
AST: 22 U/L (ref 15–41)
Albumin: 4.6 g/dL (ref 3.5–5.0)
Alkaline Phosphatase: 68 U/L (ref 38–126)
Anion gap: 8 (ref 5–15)
BUN: 7 mg/dL (ref 6–20)
CO2: 24 mmol/L (ref 22–32)
Calcium: 9.5 mg/dL (ref 8.9–10.3)
Chloride: 103 mmol/L (ref 98–111)
Creatinine, Ser: 0.89 mg/dL (ref 0.44–1.00)
GFR, Estimated: >60 mL/min (ref >60)
Glucose, Bld: 94 mg/dL (ref 70–99)
Potassium: 3.7 mmol/L (ref 3.5–5.1)
Sodium: 135 mmol/L (ref 135–145)
Total Bilirubin: 0.6 mg/dL (ref 0.3–1.2)
Total Protein: 7.6 g/dL (ref 6.5–8.1)

## 2022-01-10 LAB — LIPASE, BLOOD: Lipase: 34 U/L (ref 11–51)

## 2022-01-10 NOTE — ED Provider Triage Note (Signed)
Emergency Medicine Provider Triage Evaluation Note  Arma Reining , a 31 y.o. female  was evaluated in triage.  Pt complains of RLQ abdominal pain for the past few days associated with nausea. Previous history of fibroids and ovarian cyst. No fever or chills. No vaginal discharge or urinary symptoms.   Review of Systems  Positive: Abdominal pain Negative: fever  Physical Exam  BP (!) 150/103 (BP Location: Right Arm)    Pulse 72    Temp 99.3 F (37.4 C) (Oral)    Resp 16    SpO2 100%  Gen:   Awake, no distress   Resp:  Normal effort  MSK:   Moves extremities without difficulty  Other:  +RLQ tenderness  Medical Decision Making  Medically screening exam initiated at 6:28 PM.  Appropriate orders placed.  Oralee Rapaport was informed that the remainder of the evaluation will be completed by another provider, this initial triage assessment does not replace that evaluation, and the importance of remaining in the ED until their evaluation is complete.  Labs CT abdomen   Karie Kirks 01/10/22 2800

## 2022-01-10 NOTE — ED Triage Notes (Signed)
Pt presents to ED Pov. Pt c/o RLQ abd pain since last wednesday. Pt reports that she has concerns for her ovary. Pt reports nausea over the weekend.

## 2022-01-11 ENCOUNTER — Emergency Department (HOSPITAL_COMMUNITY): Payer: 59

## 2022-01-11 DIAGNOSIS — D259 Leiomyoma of uterus, unspecified: Secondary | ICD-10-CM | POA: Diagnosis not present

## 2022-01-11 DIAGNOSIS — R1031 Right lower quadrant pain: Secondary | ICD-10-CM | POA: Diagnosis not present

## 2022-01-11 DIAGNOSIS — R102 Pelvic and perineal pain: Secondary | ICD-10-CM | POA: Diagnosis not present

## 2022-01-11 DIAGNOSIS — Z0000 Encounter for general adult medical examination without abnormal findings: Secondary | ICD-10-CM | POA: Diagnosis not present

## 2022-01-11 LAB — URINALYSIS, ROUTINE W REFLEX MICROSCOPIC
Bilirubin Urine: NEGATIVE
Glucose, UA: NEGATIVE mg/dL
Hgb urine dipstick: NEGATIVE
Ketones, ur: NEGATIVE mg/dL
Leukocytes,Ua: NEGATIVE
Nitrite: NEGATIVE
Protein, ur: NEGATIVE mg/dL
Specific Gravity, Urine: 1.017 (ref 1.005–1.030)
pH: 5 (ref 5.0–8.0)

## 2022-01-11 LAB — PREGNANCY, URINE: Preg Test, Ur: NEGATIVE

## 2022-01-11 IMAGING — US US PELVIS COMPLETE TRANSABD/TRANSVAG W DUPLEX AND/OR DOPPLER
1 series · 13 of 25 positions shown · non-contrast
Comparison: CT abdomen pelvis dated [DATE].

CLINICAL DATA: Pelvic pain.

EXAM:
TRANSABDOMINAL AND TRANSVAGINAL ULTRASOUND OF PELVIS
DOPPLER ULTRASOUND OF OVARIES
TECHNIQUE: Both transabdominal and transvaginal ultrasound examinations of the
pelvis were performed. Transabdominal technique was performed for
global imaging of the pelvis including uterus, ovaries, adnexal
regions, and pelvic cul-de-sac.
It was necessary to proceed with endovaginal exam following the
transabdominal exam to visualize the endometrium and ovaries. Color
and duplex Doppler ultrasound was utilized to evaluate blood flow to
the ovaries.

[Series 1: us pelvic complete w transvaginal and torsion righ · 13 of 93 slices shown]
[im 1/93]
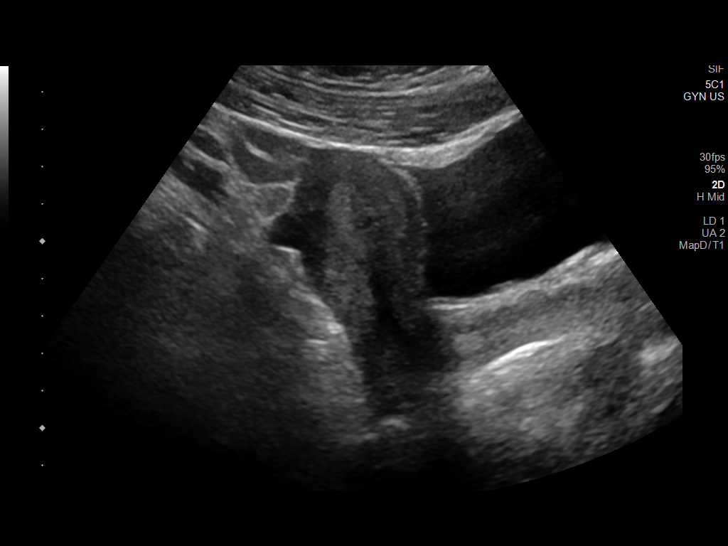
[im 8/93]
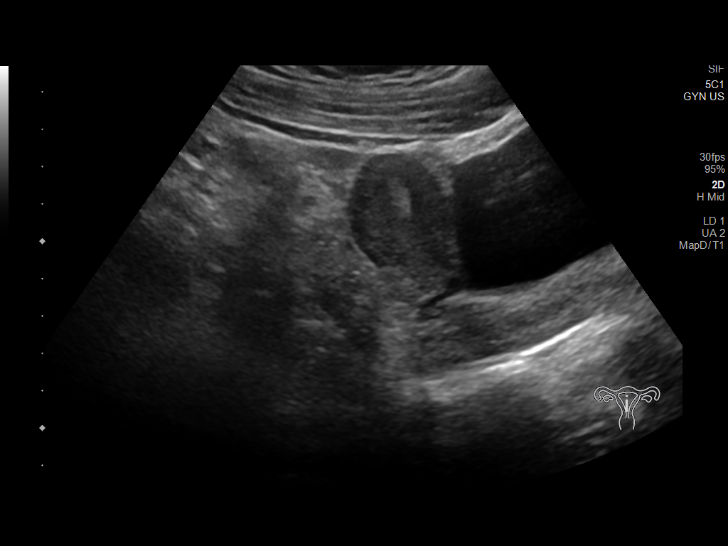
[im 16/93]
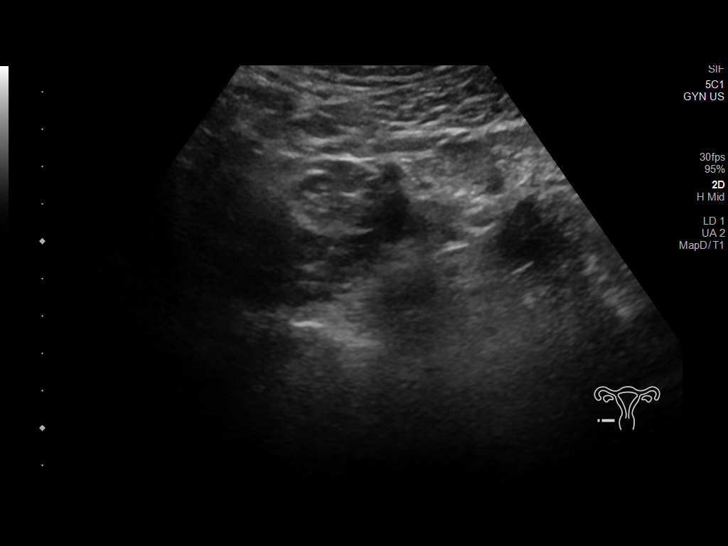
[im 24/93]
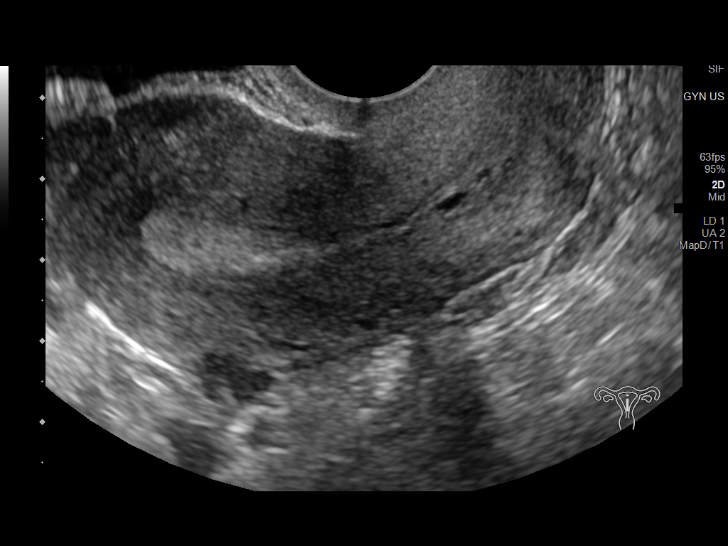
[im 31/93]
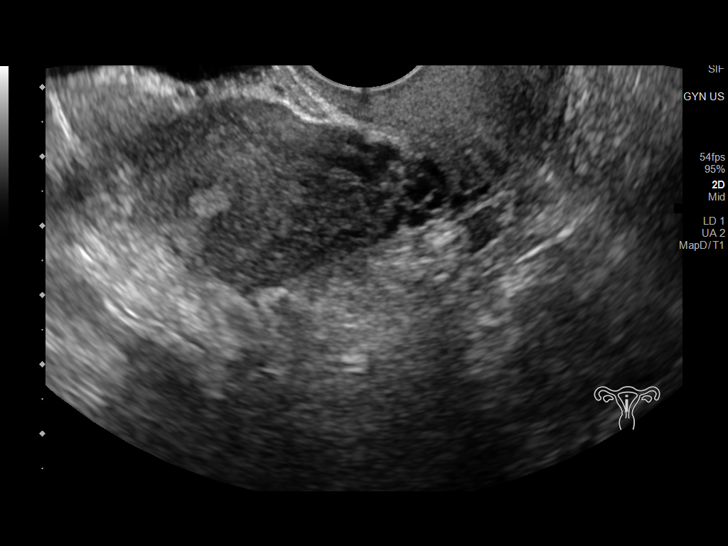
[im 39/93]
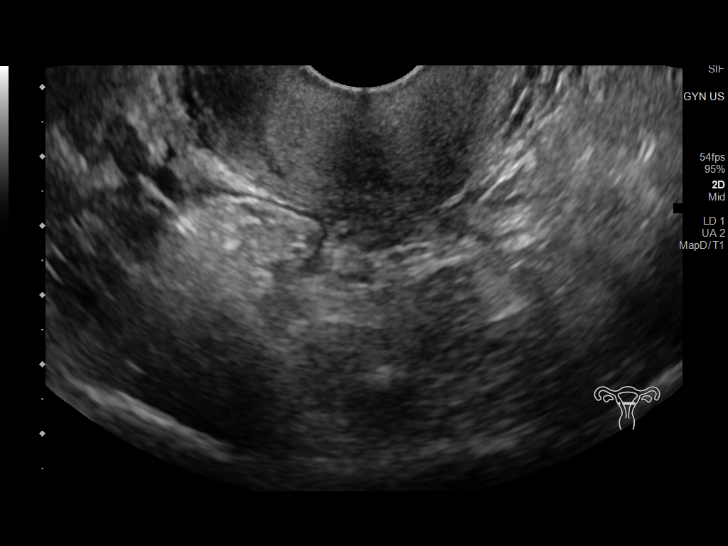
[im 47/93]
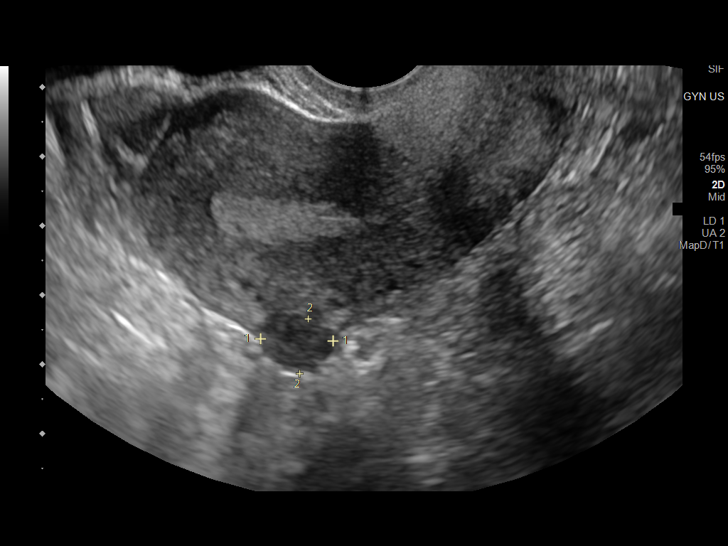
[im 54/93]
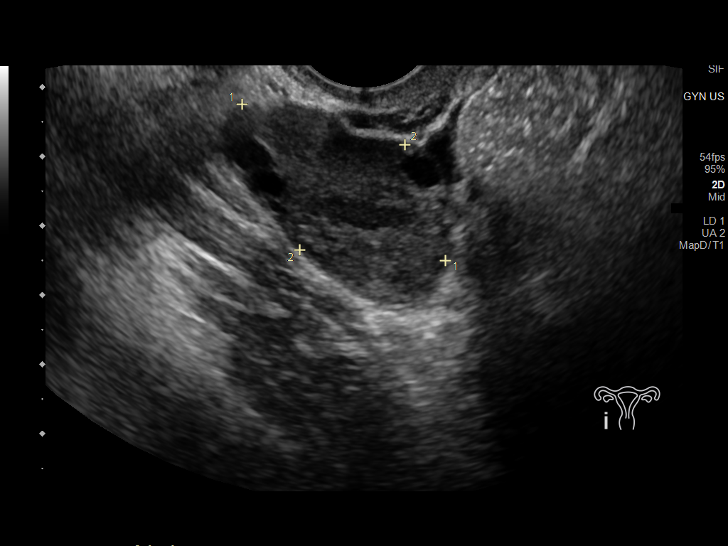
[im 62/93]
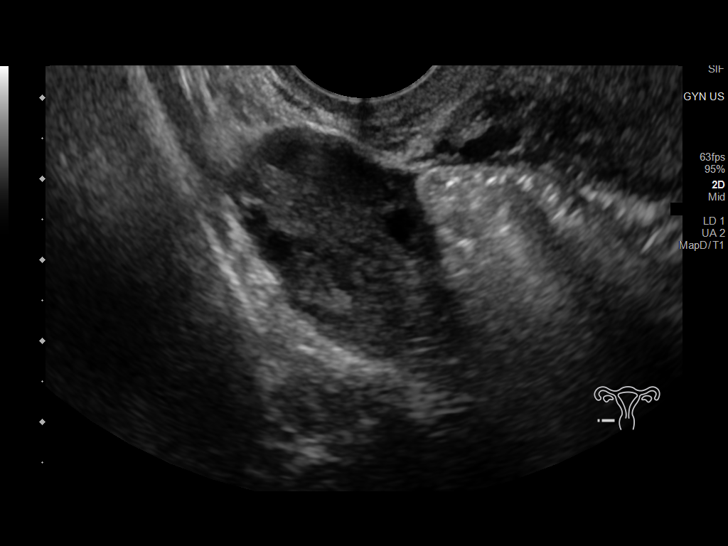
[im 70/93]
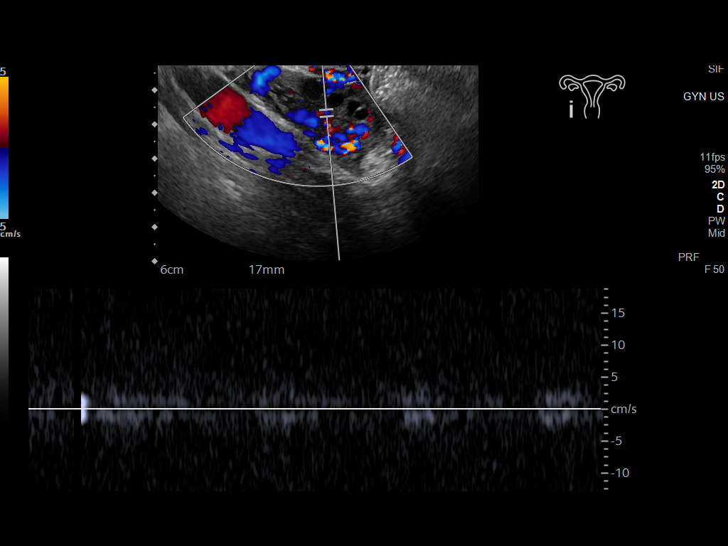
[im 77/93]
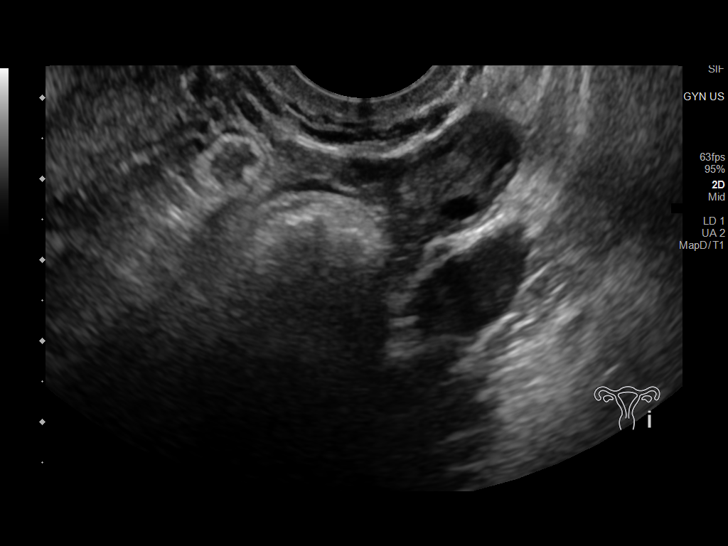
[im 85/93]
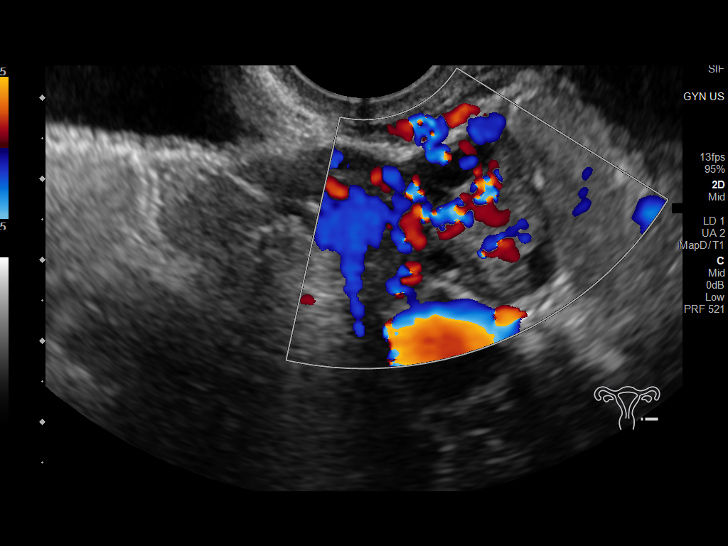
[im 93/93]
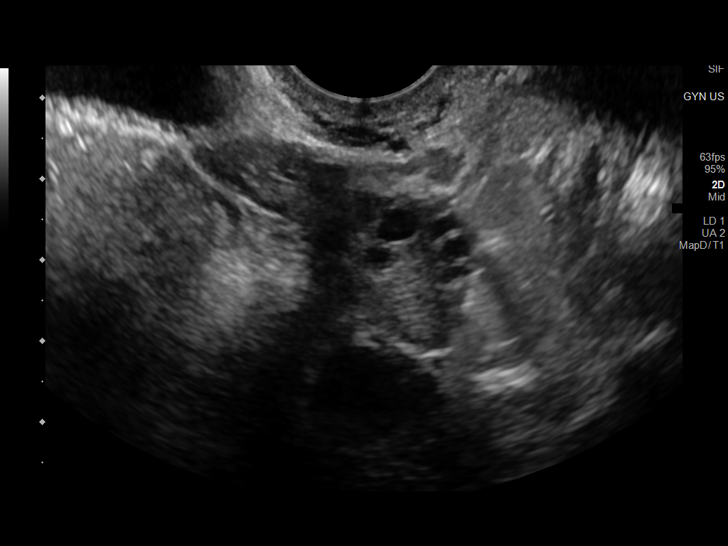

[13 of 25 positions shown; findings below may reference images not displayed]

FINDINGS: Uterus

Measurements: 6.7 x 3.6 x 4.6 cm = volume: 58 mL. The uterus is
anteverted. There is a 1.1 x 0.8 x 1.0 cm posterior body subserosal
or partially exophytic fibroid.

Endometrium

Thickness: 7 mm.  No focal abnormality visualized.

Right ovary

Measurements: 3.7 x 2.2 x 2.7 cm = volume: 11.2 mL. Normal
appearance/no adnexal mass.

Left ovary

Measurements: 3.0 x 1.6 x 1.7 cm = volume: 4.1 mL. There is a corpus
luteum in the right ovary.

Pulsed Doppler evaluation of both ovaries demonstrates normal
low-resistance arterial and venous waveforms.

Other findings

No abnormal free fluid.
IMPRESSION: Small posterior body fibroid, otherwise unremarkable pelvic
ultrasound.

## 2022-01-11 MED ORDER — ONDANSETRON 4 MG PO TBDP: 4.0000 mg | ORAL_TABLET | Freq: Three times a day (TID) | ORAL | 0 refills | Status: AC | PRN

## 2022-01-11 NOTE — ED Provider Notes (Signed)
[facility] EMERGENCY DEPARTMENT Provider Note   CSN: [id number] Arrival date & time: 01/10/22  1655     History  Chief Complaint  Patient presents with   Abdominal Pain    Janice [name] is a 31 y.o. female.   Abdominal Pain Associated symptoms: nausea    Patient is a 31 year old female presented to the emergency room today with right lower quadrant abdominal pain for 7 days she states that her pain seems to be primarily at night when she is laying back in bed.  She states however that she does have some discomfort throughout the day.  She denies any vomiting but states that she has felt somewhat nauseous over the weekend.  No chest pain or difficulty breathing.      Home Medications Prior to Admission medications   Medication Sig Start Date End Date Taking? Authorizing Provider  ondansetron (ZOFRAN-ODT) 4 MG disintegrating tablet Take 1 tablet (4 mg total) by mouth every 8 (eight) hours as needed for nausea or vomiting. 01/11/22  Yes Kaisen Ackers S, PA  AMBULATORY NON FORMULARY MEDICATION 0.125 % topical nitroglycerin ointment, apply pea-sized amount 3 times daily until symptoms resolve 08/27/20   Armbruster, Carlota Raspberry, MD  b complex vitamins capsule Take 1 capsule by mouth daily.    [provider]  cetirizine (ZYRTEC) 10 MG tablet Take 10 mg by mouth daily as needed (for seasonal allergies).     [provider]  dicyclomine (BENTYL) 20 MG tablet Take 1 tablet (20 mg total) by mouth 2 (two) times daily. 07/08/21   Wurst, [place], PA-C  fluticasone (FLONASE) 50 MCG/ACT nasal spray Place 2 sprays into both nostrils daily as needed (for seasonal allergies).     [provider]  ondansetron (ZOFRAN) 4 MG tablet Take 1 tablet (4 mg total) by mouth every 6 (six) hours. 07/08/21   Wurst, [place], PA-C  Probiotic Product (PROBIOTIC DAILY PO) Take by mouth daily.    [provider]      Allergies    Ibuprofen-famotidine     Review of Systems   Review of Systems  Gastrointestinal:  Positive for abdominal pain and nausea.   Physical Exam Updated Vital Signs BP 128/90 (BP Location: Right Arm)    Pulse 63    Temp 99.3 F (37.4 C) (Oral)    Resp 16    SpO2 100%  Physical Exam Vitals and nursing note reviewed.  Constitutional:      General: She is not in acute distress. HENT:     Head: Normocephalic and atraumatic.     Nose: Nose normal.  Eyes:     General: No scleral icterus. Cardiovascular:     Rate and Rhythm: Normal rate and regular rhythm.     Pulses: Normal pulses.     Heart sounds: Normal heart sounds.  Pulmonary:     Effort: Pulmonary effort is normal. No respiratory distress.     Breath sounds: No wheezing.  Abdominal:     Palpations: Abdomen is soft.     Tenderness: There is no abdominal tenderness.     Comments: Abdomen is soft nontender.  Specifically no tenderness over McBurney's point.  Negative Rovsing sign.  Notably patient indicates that she is having pain in the right pelvic/right lower quadrant of her abdomen however she assures me this pain is initially influenced by my exam.  Musculoskeletal:     Cervical back: Normal range of motion.     Right lower leg: No edema.  Left lower leg: No edema.  Skin:    General: Skin is warm and dry.     Capillary Refill: Capillary refill takes less than 2 seconds.  Neurological:     Mental Status: She is alert. Mental status is at baseline.  Psychiatric:        Mood and Affect: Mood normal.        Behavior: Behavior normal.    ED Results / Procedures / Treatments   Labs (all labs ordered are listed, but only abnormal results are displayed) Labs Reviewed  CBC WITH DIFFERENTIAL/PLATELET  COMPREHENSIVE METABOLIC PANEL  LIPASE, BLOOD  URINALYSIS, ROUTINE W REFLEX MICROSCOPIC  PREGNANCY, URINE  I-STAT BETA HCG BLOOD, ED ([facility], [facility], [facility] ONLY)    EKG None  Radiology US PELVIC COMPLETE W TRANSVAGINAL AND TORSION R/O  Result Date:  01/11/2022 CLINICAL DATA:  Pelvic pain. EXAM: TRANSABDOMINAL AND TRANSVAGINAL ULTRASOUND OF PELVIS DOPPLER ULTRASOUND OF OVARIES TECHNIQUE: Both transabdominal and transvaginal ultrasound examinations of the pelvis were performed. Transabdominal technique was performed for global imaging of the pelvis including uterus, ovaries, adnexal regions, and pelvic cul-de-sac. It was necessary to proceed with endovaginal exam following the transabdominal exam to visualize the endometrium and ovaries. Color and duplex Doppler ultrasound was utilized to evaluate blood flow to the ovaries. COMPARISON:  CT abdomen pelvis dated 08/06/2020. FINDINGS: Uterus Measurements: 6.7 x 3.6 x 4.6 cm = volume: 58 mL. The uterus is anteverted. There is a 1.1 x 0.8 x 1.0 cm posterior body subserosal or partially exophytic fibroid. Endometrium Thickness: 7 mm.  No focal abnormality visualized. Right ovary Measurements: 3.7 x 2.2 x 2.7 cm = volume: 11.2 mL. Normal appearance/no adnexal mass. Left ovary Measurements: 3.0 x 1.6 x 1.7 cm = volume: 4.1 mL. There is a corpus luteum in the right ovary. Pulsed Doppler evaluation of both ovaries demonstrates normal low-resistance arterial and venous waveforms. Other findings No abnormal free fluid. IMPRESSION: Small posterior body fibroid, otherwise unremarkable pelvic ultrasound. Electronically Signed   By: Anner Crete M.D.   On: 01/11/2022 03:04    Procedures Procedures    Medications Ordered in ED Medications - No data to display  ED Course/ Medical Decision Making/ A&P                           Medical Decision Making Amount and/or Complexity of Data Reviewed Labs: ordered. Radiology: ordered. ECG/medicine tests: ordered.   This patient presents to the ED for concern of abdominal pain, this involves a number of treatment options, and is a complaint that carries with it a high risk of complications and morbidity.  The differential diagnosis includes The causes of generalized  abdominal pain include but are not limited to AAA, mesenteric ischemia, appendicitis, diverticulitis, DKA, gastritis, gastroenteritis, AMI, nephrolithiasis, pancreatitis, peritonitis, adrenal insufficiency,lead poisoning, iron toxicity, intestinal ischemia, constipation, UTI,SBO/LBO, splenic rupture, biliary disease, IBD, IBS, PUD, or hepatitis. Ectopic pregnancy, ovarian torsion, PID.    Co morbidities: Discussed in HPI   Brief History:  1 week of right lower quadrant abdominal pain.  EMR reviewed including pt PMHx, past surgical history and past visits to ER.   See HPI for more details   Lab Tests:  I ordered and independently interpreted labs.  The pertinent results include:    I personally reviewed all laboratory work and imaging. Metabolic panel without any acute abnormality specifically kidney function within normal limits and no significant electrolyte abnormalities. CBC without leukocytosis or significant anemia.  Imaging Studies:  NAD. I personally reviewed all imaging studies and no acute abnormality found. I agree with radiology interpretation.  There was a small fibroid found.  No evidence of torsion.  Cardiac Monitoring:  NA NA   Medicines ordered:  New medications ordered  Critical Interventions:  Ultrasound rule out torsion   Consults: And a   Reevaluation:  After the interventions noted above I re-evaluated patient and found that they have :improved   Social Determinants of Health:  The patient's social determinants of health were a factor in the care of this patient    Problem List / ED Course:  Fibroid, pelvic pain   Dispostion:   I specifically doubt appendicitis given the full 1 week of symptoms without leukocytosis fever or worsening of symptoms.  I also suspect this is more pelvic in origin.  CBC CMP lipase and urinalysis unremarkable pregnancy negative.  Pelvic ultrasound independently reviewed  After consideration of the  diagnostic results and the patients response to treatment, I feel that the patent would benefit from follow-up with OB/GYN. Zofran for nausea as needed  Final Clinical Impression(s) / ED Diagnoses Final diagnoses:  Pelvic pain  Right lower quadrant abdominal pain    Rx / DC Orders ED Discharge Orders          Ordered    ondansetron (ZOFRAN-ODT) 4 MG disintegrating tablet  Every 8 hours PRN        01/11/22 0347              Tedd Sias, PA 16/10/96 0454    Delora Fuel, MD 09/81/19 [phone]

## 2022-01-11 NOTE — Discharge Instructions (Signed)
You have a fibroid.  It is large enough to be seen on ultrasound and concern to be causing some of your symptoms today.  I am reassured by your normal labs and your reassuring physical exam.  Please drink plenty of water and take Tylenol and ibuprofen as discussed below specifically I recommend taking a 600 mg dose of ibuprofen and a 1000 mg dose of Tylenol prior to going to bed  Please use Tylenol or ibuprofen for pain.  You may use 600 mg ibuprofen every 6 hours or 1000 mg of Tylenol every 6 hours.  You may choose to alternate between the 2.  This would be most effective.  Not to exceed 4 g of Tylenol within 24 hours.  Not to exceed 3200 mg ibuprofen 24 hours.     Please follow-up with your OB/GYN

## 2022-01-11 NOTE — ED Provider Notes (Signed)
Called CT to inquire about the wait time.  Seems that her i-STAT hCG did not result.  Patient brought back to triage and urine obtained which is negative for pregnancy.  While she is back in triage I reassessed her.  Her symptoms are not particularly concerning for an appendicitis she did has no abdominal tenderness and seems to be complaining of more pelvic pain furthermore her symptoms been ongoing for quite some time and she has no leukocytosis no nausea or vomiting  Think more likely cystic pain.  We did very lengthy approximately 15-minute shared decision-making conversation and decided to cancel the CT scan and obtain a pelvic ultrasound to rule out torsion and assess for cyst.   Tedd Sias, PA 59/09/31 1216    Delora Fuel, MD 24/46/95 [phone]

## 2022-02-07 DIAGNOSIS — M6281 Muscle weakness (generalized): Secondary | ICD-10-CM | POA: Diagnosis not present

## 2022-02-07 DIAGNOSIS — L732 Hidradenitis suppurativa: Secondary | ICD-10-CM | POA: Diagnosis not present

## 2022-02-07 DIAGNOSIS — L700 Acne vulgaris: Secondary | ICD-10-CM | POA: Diagnosis not present

## 2022-02-13 DIAGNOSIS — D252 Subserosal leiomyoma of uterus: Secondary | ICD-10-CM | POA: Diagnosis not present

## 2022-02-13 DIAGNOSIS — M6289 Other specified disorders of muscle: Secondary | ICD-10-CM | POA: Diagnosis not present

## 2022-02-13 DIAGNOSIS — R102 Pelvic and perineal pain: Secondary | ICD-10-CM | POA: Diagnosis not present

## 2022-02-28 DIAGNOSIS — M6281 Muscle weakness (generalized): Secondary | ICD-10-CM | POA: Diagnosis not present

## 2022-03-06 DIAGNOSIS — Z0000 Encounter for general adult medical examination without abnormal findings: Secondary | ICD-10-CM | POA: Diagnosis not present

## 2022-03-06 DIAGNOSIS — L732 Hidradenitis suppurativa: Secondary | ICD-10-CM | POA: Diagnosis not present

## 2022-03-08 ENCOUNTER — Ambulatory Visit (INDEPENDENT_AMBULATORY_CARE_PROVIDER_SITE_OTHER): Payer: 59

## 2022-03-08 ENCOUNTER — Other Ambulatory Visit: Payer: Self-pay

## 2022-03-08 ENCOUNTER — Ambulatory Visit: Payer: 59 | Admitting: Podiatry

## 2022-03-08 DIAGNOSIS — M779 Enthesopathy, unspecified: Secondary | ICD-10-CM

## 2022-03-08 DIAGNOSIS — Q666 Other congenital valgus deformities of feet: Secondary | ICD-10-CM

## 2022-03-08 NOTE — Progress Notes (Signed)
SITUATION ?Reason for Consult: Evaluation for Bilateral Custom Foot Orthoses ?Patient / Caregiver Report: Patient is ready for foot orthotics ? ?OBJECTIVE DATA: ?Patient History / Diagnosis:  ?  ICD-10-CM   ?1. Tendinitis  M77.9   ?  ? ? ?Current or Previous Devices:   None and no history ? ?Foot Examination: ?Skin presentation:   Intact ?Ulcers & Callousing:   None ?Toe / Foot Deformities:  Pes Planus ?Weight Bearing Presentation:  Planus ?Sensation:    Intact ? ?Shoe Size:    9 ? ?ORTHOTIC RECOMMENDATION ?Recommended Device: 1x pair of custom functional foot orthotics ? ?GOALS OF ORTHOSES ?- Reduce Pain ?- Prevent Foot Deformity ?- Prevent Progression of Further Foot Deformity ?- Relieve Pressure ?- Improve the Overall Biomechanical Function of the Foot and Lower Extremity. ? ?ACTIONS PERFORMED ?Potential out of pocket cost was communicated to patient. Patient understood and consent to casting. Patient was casted for Foot Orthoses via crush box. Procedure was explained and patient tolerated procedure well. Casts were shipped to central fabrication. All questions were answered and concerns addressed. ? ?PLAN ?Patient is to be called for fitting when devices are ready.  ? ? ? ?

## 2022-03-10 ENCOUNTER — Encounter: Payer: Self-pay | Admitting: Podiatry

## 2022-03-10 NOTE — Progress Notes (Signed)
?Subjective:  ?Patient ID: Janice [name], female    DOB: 01-22-1991,  MRN: [id number] ? ?Chief Complaint  ?Patient presents with  ? Foot Pain  ?  Right foot pain   ? ? ?31 y.o. female presents with the above complaint.  Patient presents with complaint of bilateral flatfoot with right greater than left side.  Patient states that pain is more intense with work as pain on both sides of the foot.  She wanted get it evaluated.  Hurts with ambulation has progressed gotten worse.  She does not wear any orthotics.  She wears okay shoes.  She has not seen anyone else prior to seeing me. ? ? ?Review of Systems: Negative except as noted in the HPI. Denies N/V/F/Ch. ? ?Past Medical History:  ?Diagnosis Date  ? Costochondritis   ? GERD (gastroesophageal reflux disease)   ? IBS (irritable bowel syndrome)   ? Ovarian mass, left 07/2020  ? CT scan  ? ? ?Current Outpatient Medications:  ?  AMBULATORY NON FORMULARY MEDICATION, 0.125 % topical nitroglycerin ointment, apply pea-sized amount 3 times daily until symptoms resolve, Disp: 30 g, Rfl: 1 ?  b complex vitamins capsule, Take 1 capsule by mouth daily., Disp: , Rfl:  ?  cetirizine (ZYRTEC) 10 MG tablet, Take 10 mg by mouth daily as needed (for seasonal allergies). , Disp: , Rfl:  ?  dicyclomine (BENTYL) 20 MG tablet, Take 1 tablet (20 mg total) by mouth 2 (two) times daily., Disp: 20 tablet, Rfl: 0 ?  fluticasone (FLONASE) 50 MCG/ACT nasal spray, Place 2 sprays into both nostrils daily as needed (for seasonal allergies). , Disp: , Rfl:  ?  ondansetron (ZOFRAN) 4 MG tablet, Take 1 tablet (4 mg total) by mouth every 6 (six) hours., Disp: 12 tablet, Rfl: 0 ?  ondansetron (ZOFRAN-ODT) 4 MG disintegrating tablet, Take 1 tablet (4 mg total) by mouth every 8 (eight) hours as needed for nausea or vomiting., Disp: 20 tablet, Rfl: 0 ?  Probiotic Product (PROBIOTIC DAILY PO), Take by mouth daily., Disp: , Rfl:  ? ?Social History  ? ?Tobacco Use  ?Smoking Status Never  ?Smokeless Tobacco  Never  ? ? ?Allergies  ?Allergen Reactions  ? Ibuprofen-Famotidine Hives  ? ?Objective:  ?There were no vitals filed for this visit. ?There is no height or weight on file to calculate BMI. ?Constitutional Well developed. ?Well nourished.  ?Vascular Dorsalis pedis pulses palpable bilaterally. ?Posterior tibial pulses palpable bilaterally. ?Capillary refill normal to all digits.  ?No cyanosis or clubbing noted. ?Pedal hair growth normal.  ?Neurologic Normal speech. ?Oriented to person, place, and time. ?Epicritic sensation to light touch grossly present bilaterally.  ?Dermatologic Nails well groomed and normal in appearance. ?No open wounds. ?No skin lesions.  ?Orthopedic: Gait examination shows pes planovalgus foot structure with calcaneovalgus to many toe signs partially able to recruit the arch with dorsiflexion of the hallux.  Pain on the lateral side of the foot as well as plantar heel.  ? ?Radiographs:  no/3 views of skeletally mature the right foot: There is decreasing calcaneal inclination angle increasing talar declination angle anterior break in the cyma line.  Mild midfoot arthritis noted.  No other bony abnormalities identified ?Assessment:  ? ?1. Pes planovalgus   ? ?Plan:  ?Patient was evaluated and treated and all questions answered. ? ? ? ?Bilateral pes planovalgus ?-I explained to patient the etiology of pes planovalgus and relationship with Planter fasciitis and various treatment options were discussed.  Given patient foot structure in  the setting of Planter fasciitis I believe patient will benefit from custom-made orthotics to help control the hindfoot motion support the arch of the foot and take the stress away from plantar fascial.  Patient agrees with the plan like to proceed with orthotics ?-Patient was casted for orthotics ?-Given the amount of flatfoot pain that she is having especially on the right side I believe she would benefit from cam boot immobilization.  Patient was placed in a cam  boot.  If there is no improvement we will need to discuss surgical options ? ? ?No follow-ups on file.  ?

## 2022-03-28 DIAGNOSIS — M6281 Muscle weakness (generalized): Secondary | ICD-10-CM | POA: Diagnosis not present

## 2022-04-05 ENCOUNTER — Other Ambulatory Visit: Payer: Self-pay | Admitting: Podiatry

## 2022-04-05 ENCOUNTER — Encounter: Payer: Self-pay | Admitting: Podiatry

## 2022-04-05 MED ORDER — MELOXICAM 15 MG PO TABS: 15.0000 mg | ORAL_TABLET | Freq: Every day | ORAL | 0 refills | Status: AC

## 2022-04-07 ENCOUNTER — Ambulatory Visit (INDEPENDENT_AMBULATORY_CARE_PROVIDER_SITE_OTHER): Payer: 59 | Admitting: Podiatry

## 2022-04-07 DIAGNOSIS — Q666 Other congenital valgus deformities of feet: Secondary | ICD-10-CM | POA: Diagnosis not present

## 2022-04-07 DIAGNOSIS — M7671 Peroneal tendinitis, right leg: Secondary | ICD-10-CM | POA: Diagnosis not present

## 2022-04-13 NOTE — Progress Notes (Signed)
?Subjective:  ?Patient ID: Janice [name], female    DOB: 1991/11/14,  MRN: [id number] ? ?Chief Complaint  ?Patient presents with  ? Foot Pain  ? ? ?31 y.o. female presents with the above complaint.  Patient presents with complaint of bilateral flatfoot with right greater than left side.  She states she is doing a lot better her pain has been localized to the right lateral foot.  She states the cam boot immobilization helped considerably.  She would like to discuss next treatment treatment plan.  She still has some residual pain. ? ? ?Review of Systems: Negative except as noted in the HPI. Denies N/V/F/Ch. ? ?Past Medical History:  ?Diagnosis Date  ? Costochondritis   ? GERD (gastroesophageal reflux disease)   ? IBS (irritable bowel syndrome)   ? Ovarian mass, left 07/2020  ? CT scan  ? ? ?Current Outpatient Medications:  ?  AMBULATORY NON FORMULARY MEDICATION, 0.125 % topical nitroglycerin ointment, apply pea-sized amount 3 times daily until symptoms resolve, Disp: 30 g, Rfl: 1 ?  b complex vitamins capsule, Take 1 capsule by mouth daily., Disp: , Rfl:  ?  cetirizine (ZYRTEC) 10 MG tablet, Take 10 mg by mouth daily as needed (for seasonal allergies). , Disp: , Rfl:  ?  dicyclomine (BENTYL) 20 MG tablet, Take 1 tablet (20 mg total) by mouth 2 (two) times daily., Disp: 20 tablet, Rfl: 0 ?  fluticasone (FLONASE) 50 MCG/ACT nasal spray, Place 2 sprays into both nostrils daily as needed (for seasonal allergies). , Disp: , Rfl:  ?  meloxicam (MOBIC) 15 MG tablet, Take 1 tablet (15 mg total) by mouth daily., Disp: 30 tablet, Rfl: 0 ?  ondansetron (ZOFRAN) 4 MG tablet, Take 1 tablet (4 mg total) by mouth every 6 (six) hours., Disp: 12 tablet, Rfl: 0 ?  ondansetron (ZOFRAN-ODT) 4 MG disintegrating tablet, Take 1 tablet (4 mg total) by mouth every 8 (eight) hours as needed for nausea or vomiting., Disp: 20 tablet, Rfl: 0 ?  Probiotic Product (PROBIOTIC DAILY PO), Take by mouth daily., Disp: , Rfl:  ? ?Social History   ? ?Tobacco Use  ?Smoking Status Never  ?Smokeless Tobacco Never  ? ? ?Allergies  ?Allergen Reactions  ? Ibuprofen-Famotidine Hives  ? ?Objective:  ?There were no vitals filed for this visit. ?There is no height or weight on file to calculate BMI. ?Constitutional Well developed. ?Well nourished.  ?Vascular Dorsalis pedis pulses palpable bilaterally. ?Posterior tibial pulses palpable bilaterally. ?Capillary refill normal to all digits.  ?No cyanosis or clubbing noted. ?Pedal hair growth normal.  ?Neurologic Normal speech. ?Oriented to person, place, and time. ?Epicritic sensation to light touch grossly present bilaterally.  ?Dermatologic Nails well groomed and normal in appearance. ?No open wounds. ?No skin lesions.  ?Orthopedic: Gait examination shows pes planovalgus foot structure with calcaneovalgus to many toe signs partially able to recruit the arch with dorsiflexion of the hallux.  Pain on the lateral side of the foot as well as plantar heel.  ? ?Radiographs:  no/3 views of skeletally mature the right foot: There is decreasing calcaneal inclination angle increasing talar declination angle anterior break in the cyma line.  Mild midfoot arthritis noted.  No other bony abnormalities identified ?Assessment:  ? ?1. Peroneal tendinitis, right   ?2. Pes planovalgus   ? ? ?Plan:  ?Patient was evaluated and treated and all questions answered. ? ?Right peroneal tendinitis ?-Given the amount of pain that patient is experiencing I believe patient would benefit from a steroid  injection to help decrease acute inflammatory component associate with pain.  Patient agrees with plan like to proceed with steroid injection.  I discussed with her there is a risk of rupture associated with it given that this is a steroid neuro tendon.  She states understanding. ?-She can slowly start transitioning away from the boot into regular shoes with insoles ? ? ?Bilateral pes planovalgus ?-I explained to patient the etiology of pes planovalgus  and relationship with Planter fasciitis and various treatment options were discussed.  Given patient foot structure in the setting of Planter fasciitis I believe patient will benefit from custom-made orthotics to help control the hindfoot motion support the arch of the foot and take the stress away from plantar fascial.  Patient agrees with the plan like to proceed with orthotics ?-Patient was casted for orthotics ?- ? ? ?No follow-ups on file.  ?

## 2022-04-14 ENCOUNTER — Ambulatory Visit: Payer: 59

## 2022-04-14 DIAGNOSIS — M7671 Peroneal tendinitis, right leg: Secondary | ICD-10-CM

## 2022-04-14 DIAGNOSIS — Q666 Other congenital valgus deformities of feet: Secondary | ICD-10-CM

## 2022-04-14 NOTE — Progress Notes (Signed)
SITUATION: ?Reason for Visit: Fitting and Delivery of Custom Fabricated Foot Orthoses ?Patient Report: Patient reports comfort and is satisfied with device. ? ?OBJECTIVE DATA: ?Patient History / Diagnosis:   ?  ICD-10-CM   ?1. Pes planovalgus  Q66.6   ?  ?2. Peroneal tendinitis, right  M76.71   ?  ? ? ?Provided Device:  Custom Functional Foot Orthotics ?    [facility]: [id number] ? ?GOAL OF ORTHOSIS ?- Improve gait ?- Decrease energy expenditure ?- Improve Balance ?- Provide Triplanar stability of foot complex ?- Facilitate motion ? ?ACTIONS PERFORMED ?Patient was fit with foot orthotics trimmed to shoe last. Patient tolerated fittign procedure.  ? ?Patient was provided with verbal and written instruction and demonstration regarding donning, doffing, wear, care, proper fit, function, purpose, cleaning, and use of the orthosis and in all related precautions and risks and benefits regarding the orthosis. ? ?Patient was also provided with verbal instruction regarding how to report any failures or malfunctions of the orthosis and necessary follow up care. Patient was also instructed to contact our office regarding any change in status that may affect the function of the orthosis. ? ?Patient demonstrated independence with proper donning, doffing, and fit and verbalized understanding of all instructions. ? ?PLAN: ?Patient is to follow up in one week or as necessary (PRN). All questions were answered and concerns addressed. Plan of care was discussed with and agreed upon by the patient. ? ?

## 2022-04-20 ENCOUNTER — Ambulatory Visit: Payer: 59

## 2022-04-20 DIAGNOSIS — Q666 Other congenital valgus deformities of feet: Secondary | ICD-10-CM

## 2022-04-20 NOTE — Progress Notes (Signed)
SITUATION ?Reason for Consult: Follow-up with custom foot orthotics ?Patient / Caregiver Report: Patient feels her right foot orthotic is shorter than her left ? ?OBJECTIVE DATA ?History / Diagnosis:  ?  ICD-10-CM   ?1. Pes planovalgus  Q66.6   ?  ? ? ?Change in Pathology: None ? ?ACTIONS PERFORMED ?Patient's equipment was checked for structural stability and fit. Showed patient the foot orthotics are the same length and that patient needs to complete wear schedule. Patient has been wearing a CAM boot on her right foot and is not used to wearing a shoe. Patient agrees and will complete wear schedule. Device(s) intact and fit is excellent. All questions answered and concerns addressed. ? ?PLAN ?Follow-up as needed (PRN). Plan of care discussed with and agreed upon by patient / caregiver. ? ?

## 2022-05-01 ENCOUNTER — Ambulatory Visit: Payer: 59

## 2022-05-01 DIAGNOSIS — M7671 Peroneal tendinitis, right leg: Secondary | ICD-10-CM

## 2022-05-01 DIAGNOSIS — Q666 Other congenital valgus deformities of feet: Secondary | ICD-10-CM

## 2022-05-01 NOTE — Progress Notes (Signed)
SITUATION ?Reason for Consult: Follow-up with foot orthotics ?Patient / Caregiver Report: Patient still feels the toes are short ? ?OBJECTIVE DATA ?History / Diagnosis:  ?  ICD-10-CM   ?1. Pes planovalgus  Q66.6   ?  ?2. Peroneal tendinitis, right  M76.71   ?  ? ? ?Change in Pathology: None ? ?ACTIONS PERFORMED ?Patient's equipment was checked for structural stability and fit. Orthoses returned to [facility] for forefoot lengthening to next size up, 10. All questions answered and concerns addressed. ? ?PLAN ?Follow-up as needed (PRN). Plan of care discussed with and agreed upon by patient / caregiver. ? ?

## 2022-05-02 DIAGNOSIS — M6281 Muscle weakness (generalized): Secondary | ICD-10-CM | POA: Diagnosis not present

## 2022-05-10 DIAGNOSIS — R1031 Right lower quadrant pain: Secondary | ICD-10-CM | POA: Diagnosis not present

## 2022-05-10 DIAGNOSIS — Z0000 Encounter for general adult medical examination without abnormal findings: Secondary | ICD-10-CM | POA: Diagnosis not present

## 2022-05-10 DIAGNOSIS — N907 Vulvar cyst: Secondary | ICD-10-CM | POA: Diagnosis not present

## 2022-05-10 DIAGNOSIS — L729 Follicular cyst of the skin and subcutaneous tissue, unspecified: Secondary | ICD-10-CM | POA: Diagnosis not present

## 2022-05-12 ENCOUNTER — Ambulatory Visit (INDEPENDENT_AMBULATORY_CARE_PROVIDER_SITE_OTHER): Payer: 59 | Admitting: Podiatry

## 2022-05-12 DIAGNOSIS — M7671 Peroneal tendinitis, right leg: Secondary | ICD-10-CM | POA: Diagnosis not present

## 2022-05-12 DIAGNOSIS — Q666 Other congenital valgus deformities of feet: Secondary | ICD-10-CM

## 2022-05-17 NOTE — Progress Notes (Signed)
Subjective:  Patient ID: Janice [name], female    DOB: [birth year],  MRN: [id number]  Chief Complaint  Patient presents with   Foot Pain    Right foot pain pt stated that she still has some pain     31 y.o. female presents with the above complaint.  Patient presents with follow-up of bilateral flatfoot deformity right greater than left side.  She is still having some peroneal tendinitis and the same pain.  She states the injection did not help much.  She is here for next treatment plan.   Review of Systems: Negative except as noted in the HPI. Denies N/V/F/Ch.  Past Medical History:  Diagnosis Date   Costochondritis    GERD (gastroesophageal reflux disease)    IBS (irritable bowel syndrome)    Ovarian mass, left 07/2020   CT scan    Current Outpatient Medications:    AMBULATORY NON FORMULARY MEDICATION, 0.125 % topical nitroglycerin ointment, apply pea-sized amount 3 times daily until symptoms resolve, Disp: 30 g, Rfl: 1   b complex vitamins capsule, Take 1 capsule by mouth daily., Disp: , Rfl:    cetirizine (ZYRTEC) 10 MG tablet, Take 10 mg by mouth daily as needed (for seasonal allergies). , Disp: , Rfl:    dicyclomine (BENTYL) 20 MG tablet, Take 1 tablet (20 mg total) by mouth 2 (two) times daily., Disp: 20 tablet, Rfl: 0   fluticasone (FLONASE) 50 MCG/ACT nasal spray, Place 2 sprays into both nostrils daily as needed (for seasonal allergies). , Disp: , Rfl:    meloxicam (MOBIC) 15 MG tablet, Take 1 tablet (15 mg total) by mouth daily., Disp: 30 tablet, Rfl: 0   ondansetron (ZOFRAN) 4 MG tablet, Take 1 tablet (4 mg total) by mouth every 6 (six) hours., Disp: 12 tablet, Rfl: 0   ondansetron (ZOFRAN-ODT) 4 MG disintegrating tablet, Take 1 tablet (4 mg total) by mouth every 8 (eight) hours as needed for nausea or vomiting., Disp: 20 tablet, Rfl: 0   Probiotic Product (PROBIOTIC DAILY PO), Take by mouth daily., Disp: , Rfl:   Social History   Tobacco Use  Smoking Status Never   Smokeless Tobacco Never    Allergies  Allergen Reactions   Ibuprofen-Famotidine Hives   Objective:  There were no vitals filed for this visit. There is no height or weight on file to calculate BMI. Constitutional Well developed. Well nourished.  Vascular Dorsalis pedis pulses palpable bilaterally. Posterior tibial pulses palpable bilaterally. Capillary refill normal to all digits.  No cyanosis or clubbing noted. Pedal hair growth normal.  Neurologic Normal speech. Oriented to person, place, and time. Epicritic sensation to light touch grossly present bilaterally.  Dermatologic Nails well groomed and normal in appearance. No open wounds. No skin lesions.  Orthopedic: Gait examination shows pes planovalgus foot structure with calcaneovalgus to many toe signs partially able to recruit the arch with dorsiflexion of the hallux.  Pain on the lateral side of the foot as well as plantar heel.   Radiographs:  no/3 views of skeletally mature the right foot: There is decreasing calcaneal inclination angle increasing talar declination angle anterior break in the cyma line.  Mild midfoot arthritis noted.  No other bony abnormalities identified Assessment:   1. Peroneal tendinitis, right   2. Pes planovalgus      Plan:  Patient was evaluated and treated and all questions answered.  Right peroneal tendinitis -Clinically her pain has not resolved with steroid injection as well as cam boot immobilization.  Given  the amount of pain that she is still experiencing I believe she will benefit from an MRI evaluation to assess the peroneal tendon. -MRI was ordered  Bilateral pes planovalgus -I explained to patient the etiology of pes planovalgus and relationship with Planter fasciitis and various treatment options were discussed.  Given patient foot structure in the setting of Planter fasciitis I believe patient will benefit from custom-made orthotics to help control the hindfoot motion support  the arch of the foot and take the stress away from plantar fascial.  Patient agrees with the plan like to proceed with orthotics -Patient has been functioning orthotics without any acute complaints -   No follow-ups on file.

## 2022-05-22 ENCOUNTER — Other Ambulatory Visit: Payer: Self-pay | Admitting: Podiatry

## 2022-05-22 ENCOUNTER — Telehealth: Payer: Self-pay | Admitting: Podiatry

## 2022-05-22 DIAGNOSIS — M779 Enthesopathy, unspecified: Secondary | ICD-10-CM

## 2022-05-22 NOTE — Telephone Encounter (Signed)
Patient stated that her MRI was for her Ankle, but she would like the MRI of the whole foot to see what is actually going on.   Please advise

## 2022-05-22 NOTE — Telephone Encounter (Signed)
Patient is calling because she is not sure if she would benefit from the MRI ankle or the MRI foot, is having most of the pain in the foot but some pain in the ankle as well.  She would like to ask the physician a few questions. Please advise.

## 2022-05-23 ENCOUNTER — Ambulatory Visit: Payer: 59

## 2022-05-23 DIAGNOSIS — M7671 Peroneal tendinitis, right leg: Secondary | ICD-10-CM

## 2022-05-23 DIAGNOSIS — Q666 Other congenital valgus deformities of feet: Secondary | ICD-10-CM

## 2022-05-23 NOTE — Progress Notes (Signed)
SITUATION: Reason for Visit: Fitting and Delivery of Custom Fabricated Foot Orthoses Patient Report: Patient reports comfort and is satisfied with device.  OBJECTIVE DATA: Patient History / Diagnosis:     ICD-10-CM   1. Pes planovalgus  Q66.6     2. Peroneal tendinitis, right  M76.71       Provided Device:  Custom Functional Foot Orthotics     [employer]: [id number]  GOAL OF ORTHOSIS - Improve gait - Decrease energy expenditure - Improve Balance - Provide Triplanar stability of foot complex - Facilitate motion  ACTIONS PERFORMED Patient was fit with foot orthotics trimmed to shoe last. Patient tolerated fittign procedure.   Patient was provided with verbal and written instruction and demonstration regarding donning, doffing, wear, care, proper fit, function, purpose, cleaning, and use of the orthosis and in all related precautions and risks and benefits regarding the orthosis.  Patient was also provided with verbal instruction regarding how to report any failures or malfunctions of the orthosis and necessary follow up care. Patient was also instructed to contact our office regarding any change in status that may affect the function of the orthosis.  Patient demonstrated independence with proper donning, doffing, and fit and verbalized understanding of all instructions.  PLAN: Patient is to follow up in one week or as necessary (PRN). All questions were answered and concerns addressed. Plan of care was discussed with and agreed upon by the patient.

## 2022-06-05 ENCOUNTER — Ambulatory Visit
Admission: RE | Admit: 2022-06-05 | Discharge: 2022-06-05 | Disposition: A | Discharge status: Home / Self Care | Payer: 59 | Source: Ambulatory Visit | Attending: Podiatry | Admitting: Podiatry

## 2022-06-05 DIAGNOSIS — M7671 Peroneal tendinitis, right leg: Secondary | ICD-10-CM

## 2022-06-05 DIAGNOSIS — M779 Enthesopathy, unspecified: Secondary | ICD-10-CM

## 2022-06-05 IMAGING — MR MR FOOT*R* W/O CM
4 of 5 series · 19 of 40 positions shown · non-contrast
Comparison: None Available.

CLINICAL DATA: Chronic foot pain.

EXAM:
MRI OF THE RIGHT FOREFOOT WITHOUT CONTRAST
TECHNIQUE: Multiplanar, multisequence MR imaging of the right forefoot was
performed. No intravenous contrast was administered.

[Series 4: T1 · coronal · 3.0mm · 0.19mm/px · 3 of 46 slices shown (1 of 2)]
[im 9/46]
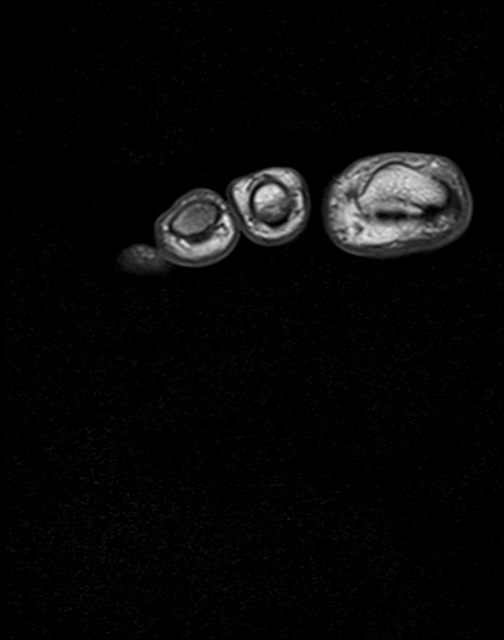
[im 25/46]
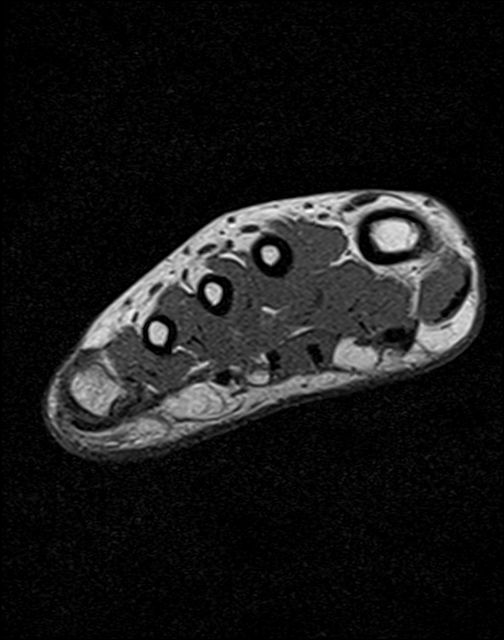
[im 41/46]
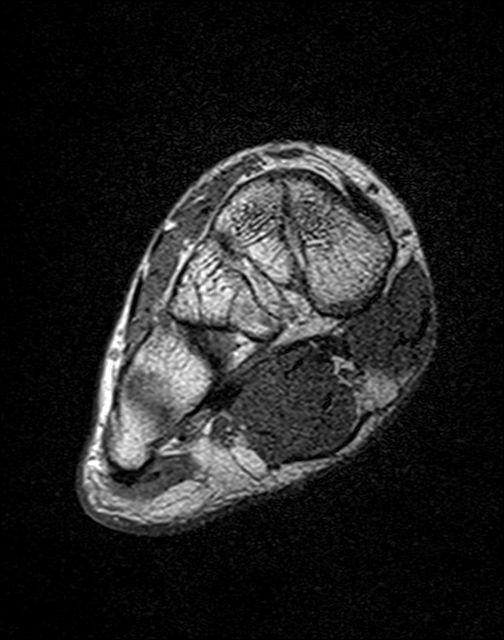

[Series 5: T2 fat-sat · coronal · 3.0mm · 0.19mm/px · 10 of 46 slices shown (1 of 2)]
[im 1/46]
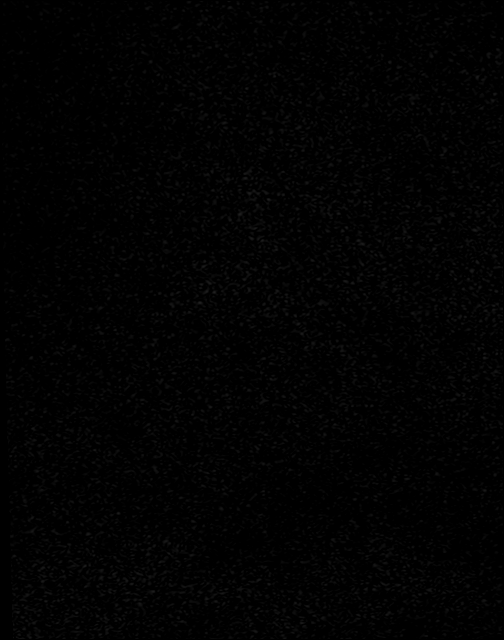
[im 5/46]
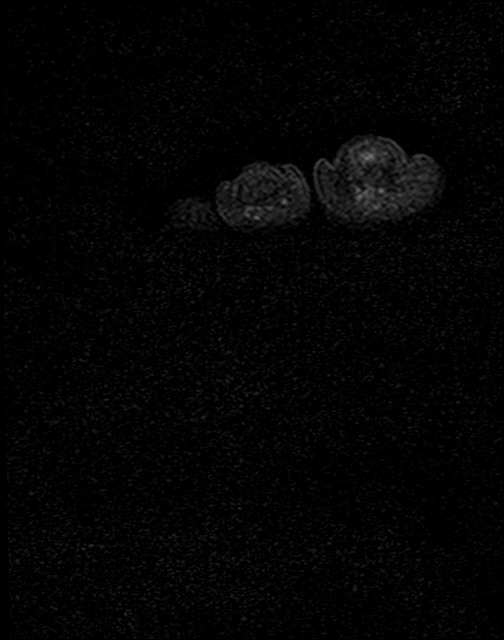
[im 10/46]
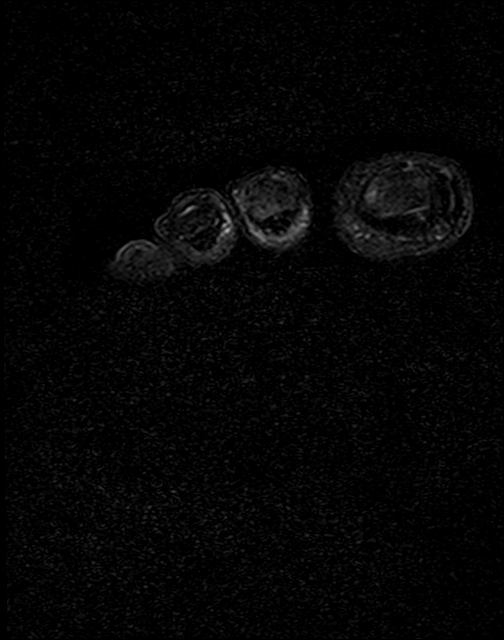
[im 14/46]
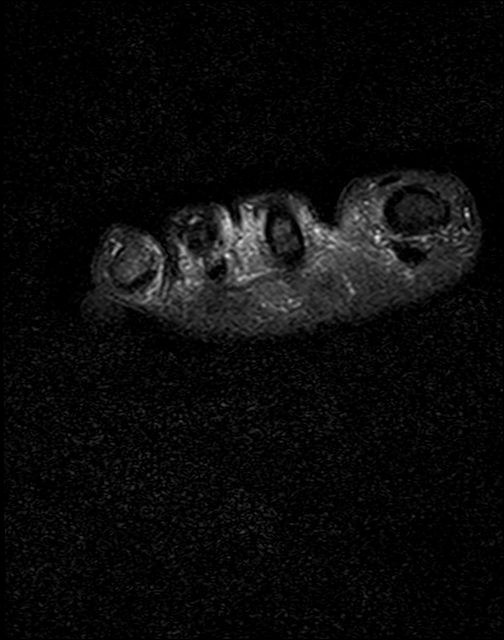
[im 19/46]
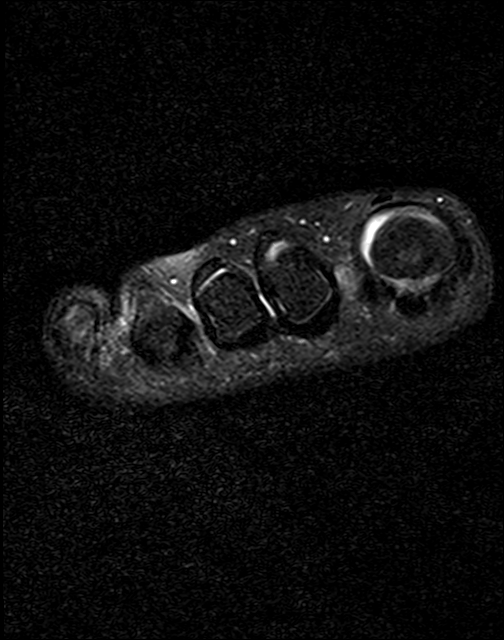
[im 23/46]
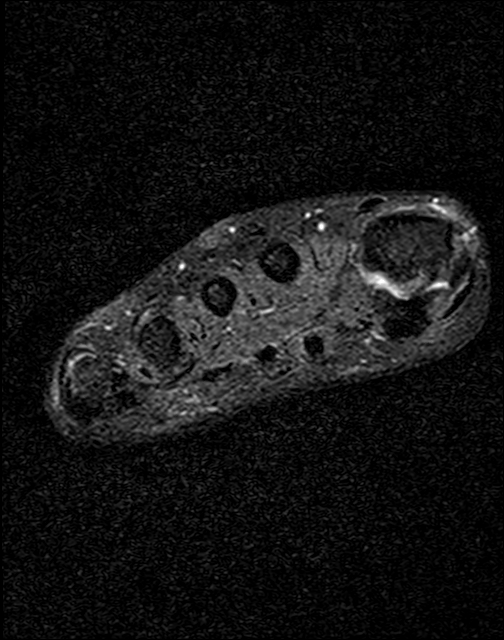
[im 28/46]
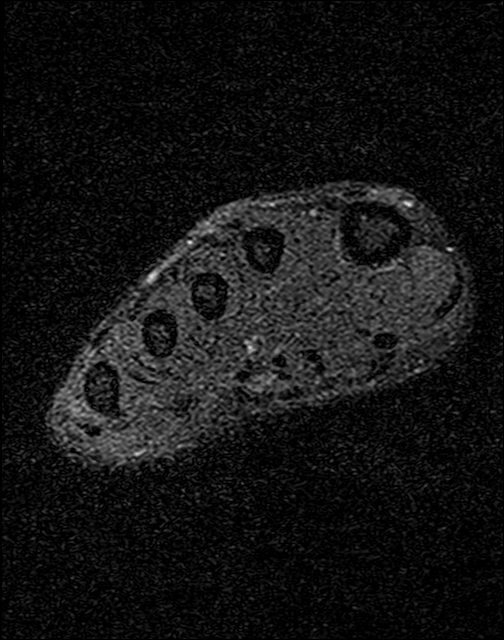
[im 32/46]
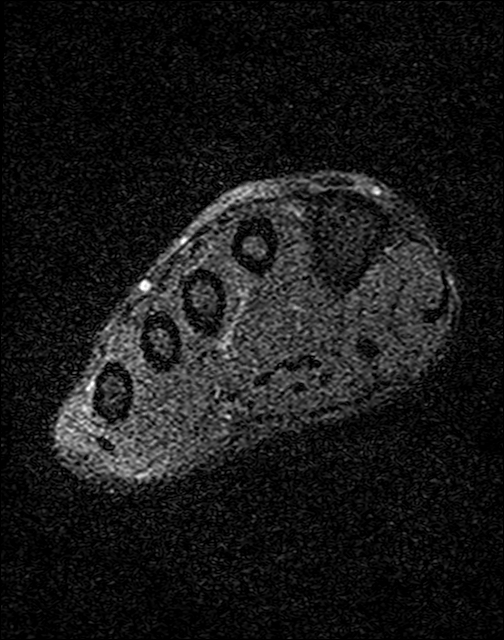
[im 37/46]
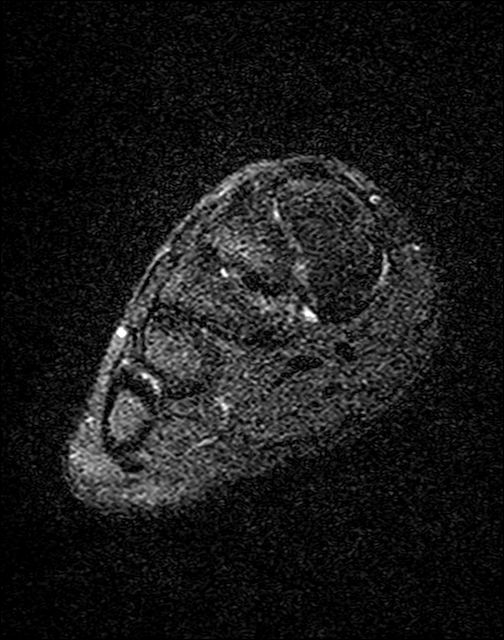
[im 41/46]
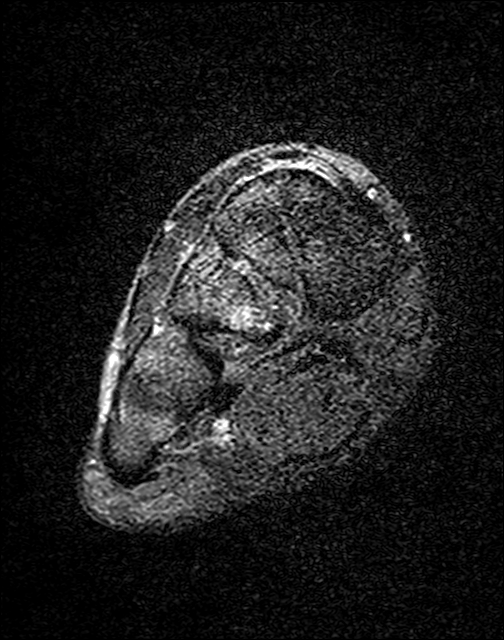

[Series 6: T2 fat-sat · axial · 3.0mm · 0.35mm/px · z∈[-167,-91]mm · 3 of 22 slices shown (2 of 2)]
[im 1/22]
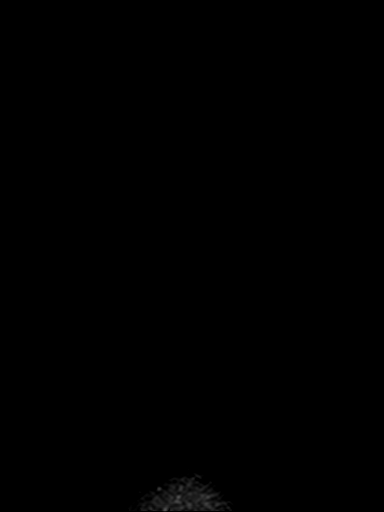
[im 11/22]
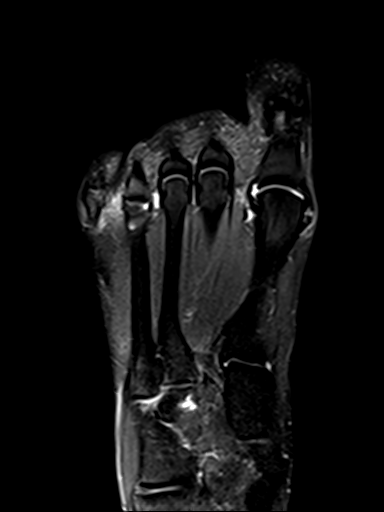
[im 22/22]
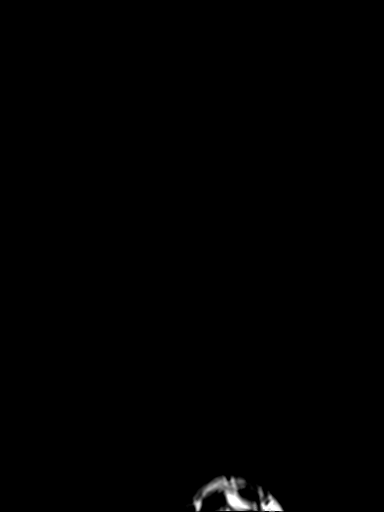

[Series 7: T1 · axial · 3.0mm · 0.35mm/px · z∈[-167,-91]mm · 3 of 22 slices shown (2 of 2)]
[im 1/22]
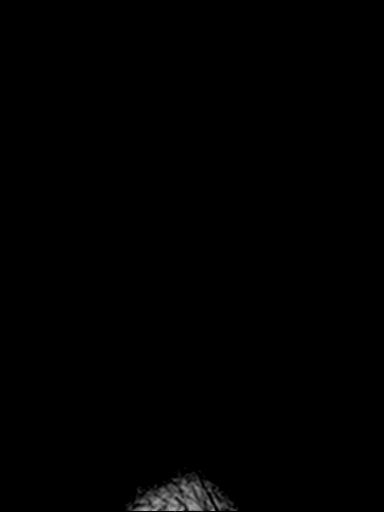
[im 11/22]
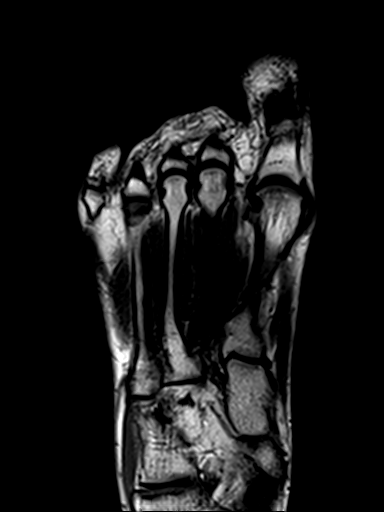
[im 22/22]
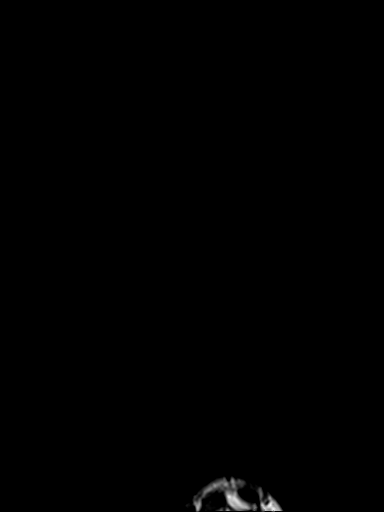

[19 of 40 positions shown; findings below may reference images not displayed]

FINDINGS: Bones/Joint/Cartilage

No fracture or dislocation. Normal alignment. No joint effusion. No
marrow signal abnormality. Small joint effusion at the first
metatarsophalangeal joint with subchondral cystic changes.

Ligaments

Collateral ligaments are intact.  Lisfranc ligament is intact.

Muscles and Tendons

Flexor, peroneal and extensor compartment tendons are intact.
Muscles are normal.

Soft tissue
No fluid collection or hematoma.  No soft tissue mass.
IMPRESSION: 1.  No evidence of fracture or dislocation.

2.  Mild first metatarsal phalangeal joint arthritic changes.

3.  Tendons and ligaments are intact.

4.  Skin and subcutaneous soft tissues are within normal limits.

## 2022-06-05 IMAGING — MR MR ANKLE*R* W/O CM
5 series · 40 of 40 positions shown · non-contrast
Comparison: Radiographs dated [DATE]

CLINICAL DATA: Ankle pain, tendon abnormality suspected.

EXAM:
MRI OF THE RIGHT ANKLE WITHOUT CONTRAST
TECHNIQUE: Multiplanar, multisequence MR imaging of the ankle was performed. No
intravenous contrast was administered.

[Series 1: T2 fat-sat · axial · 3.0mm · 0.50mm/px · z∈[-112,+9]mm · 10 of 32 slices shown (1 of 2)]
[im 1/32]
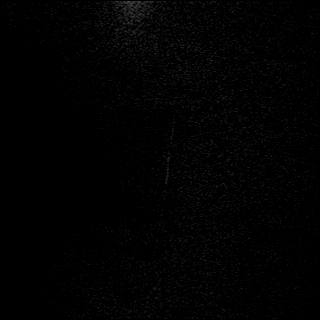
[im 4/32]
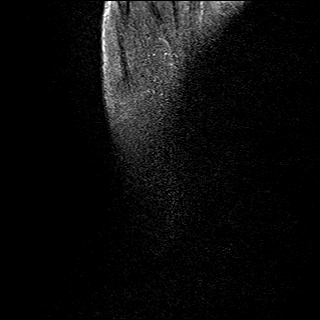
[im 7/32]
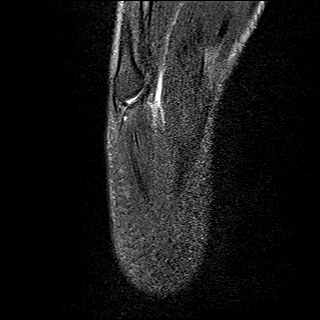
[im 11/32]
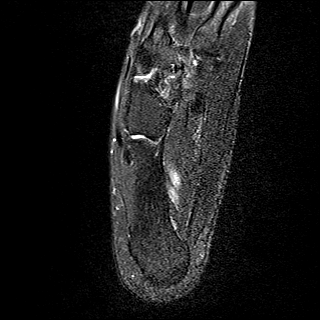
[im 14/32]
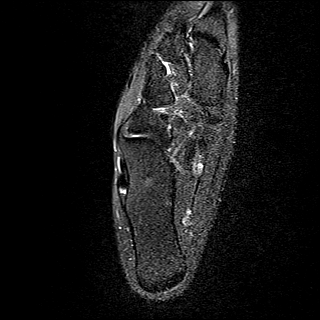
[im 18/32]
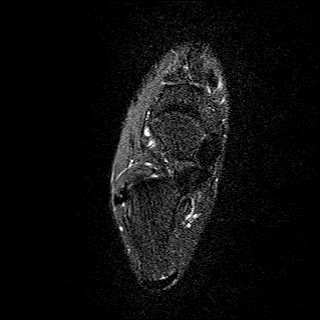
[im 21/32]
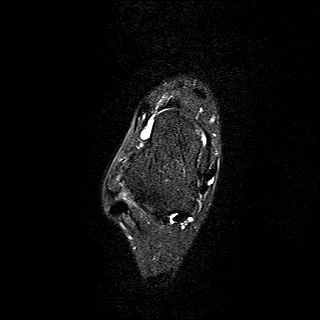
[im 25/32]
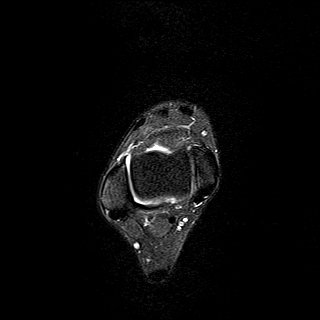
[im 28/32]
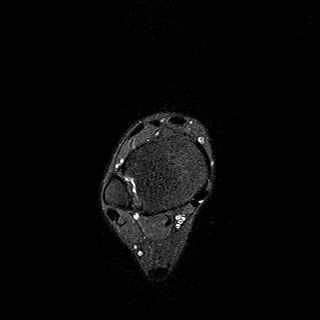
[im 32/32]
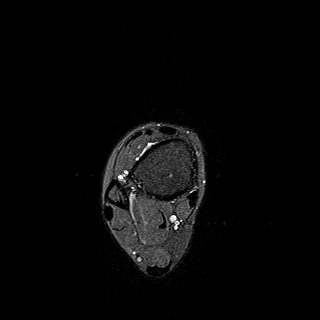

[Series 2: PD fat-sat · axial · 3.0mm · 0.50mm/px · z∈[-112,+9]mm · 9 of 32 slices shown]
[im 1/32]
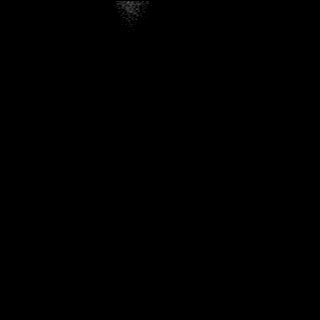
[im 4/32]
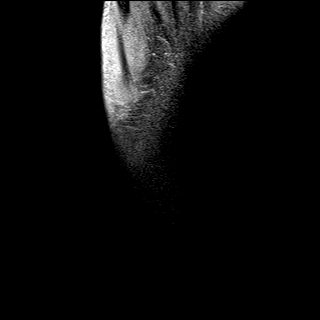
[im 8/32]
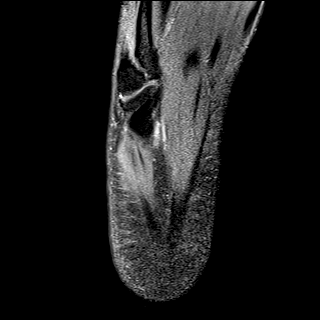
[im 12/32]
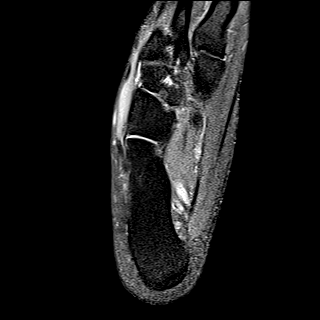
[im 16/32]
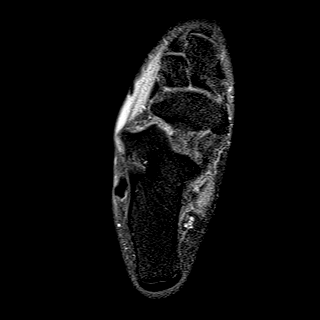
[im 20/32]
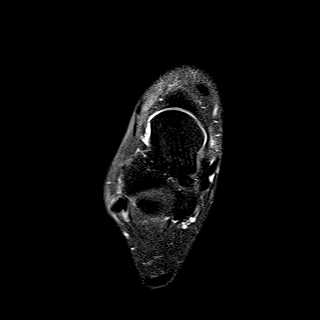
[im 24/32]
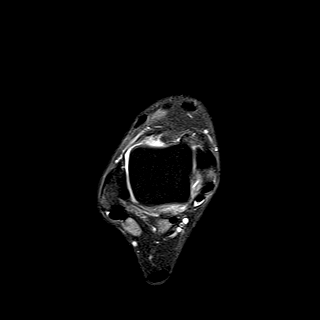
[im 28/32]
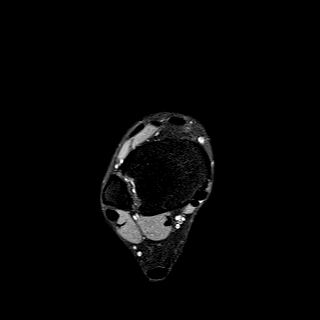
[im 32/32]
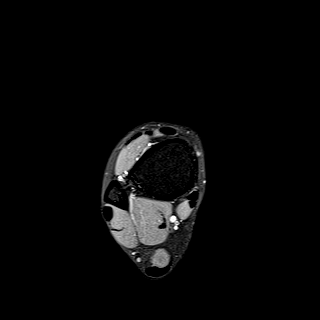

[Series 3: T1 · sagittal · 4.0mm · 0.56mm/px · 5 of 19 slices shown]
[im 1/19]
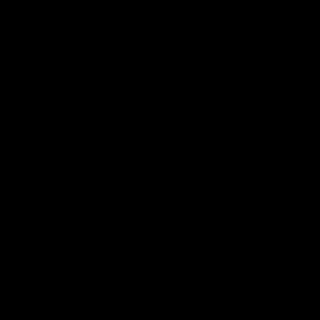
[im 5/19]
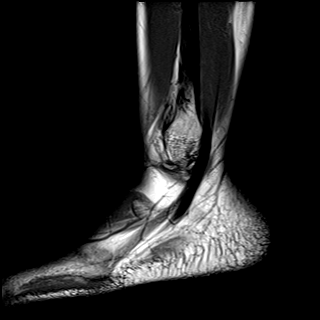
[im 10/19]
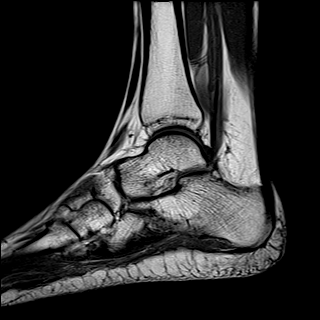
[im 14/19]
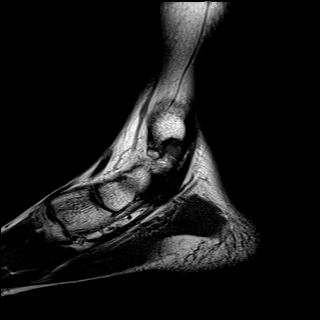
[im 19/19]
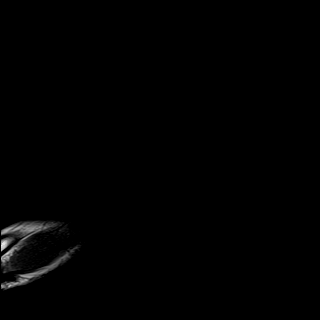

[Series 4: STIR · sagittal · 4.0mm · 0.35mm/px · 5 of 19 slices shown]
[im 1/19]
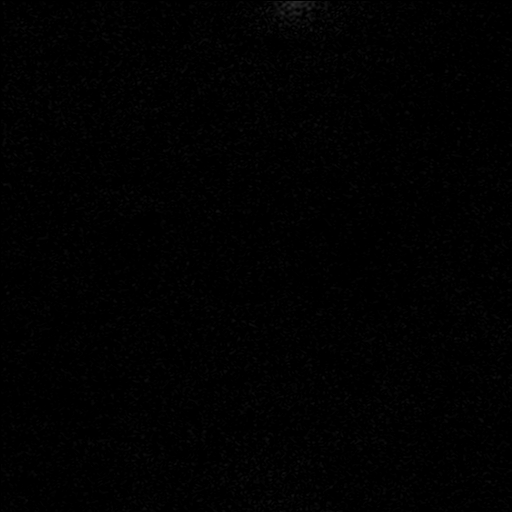
[im 5/19]
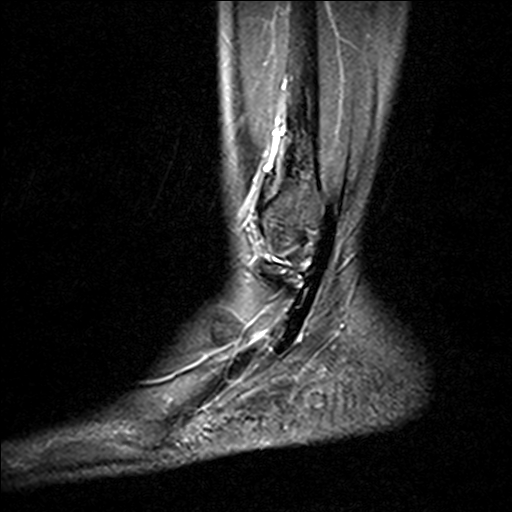
[im 10/19]
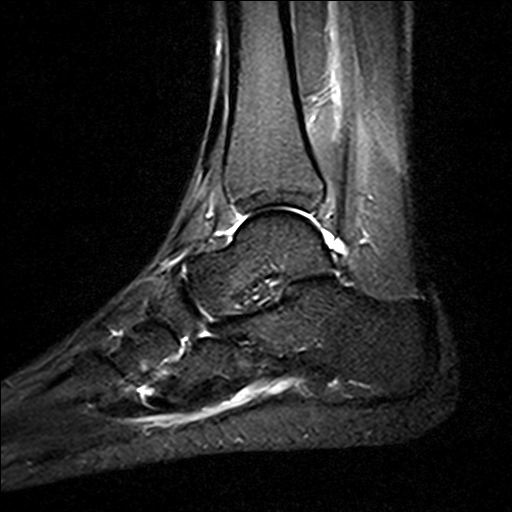
[im 14/19]
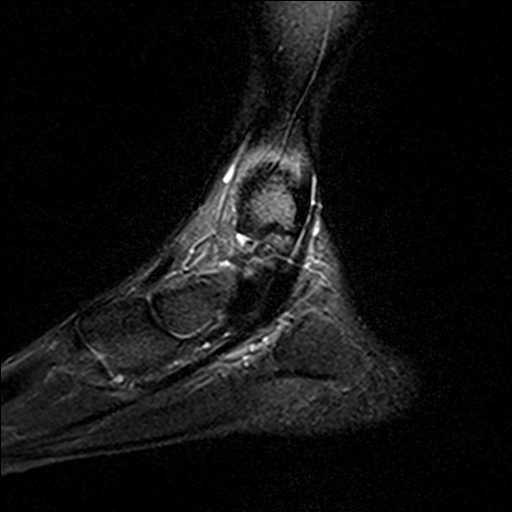
[im 19/19]
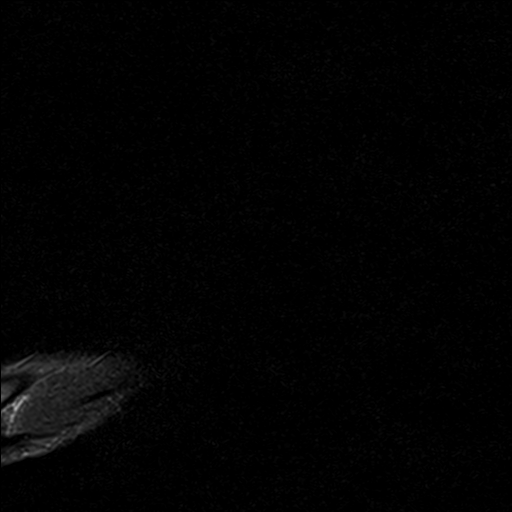

[Series 5: T2 fat-sat · coronal · 3.0mm · 0.50mm/px · 11 of 39 slices shown (2 of 2)]
[im 1/39]
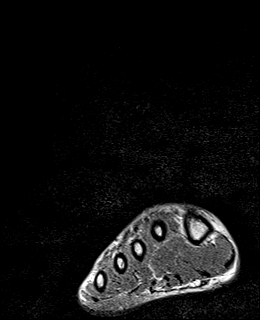
[im 4/39]
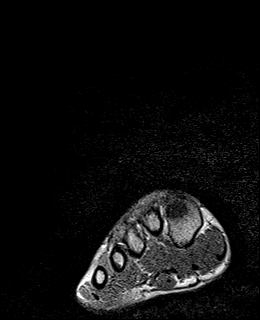
[im 8/39]
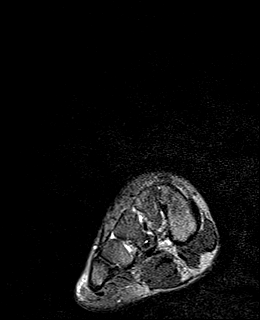
[im 12/39]
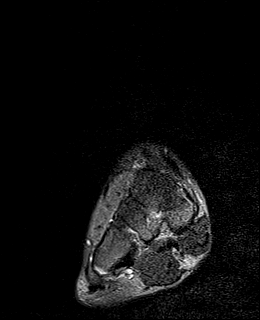
[im 16/39]
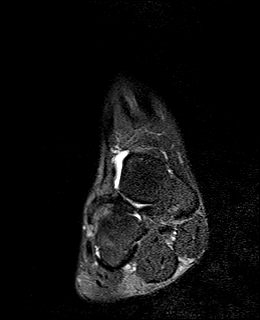
[im 20/39]
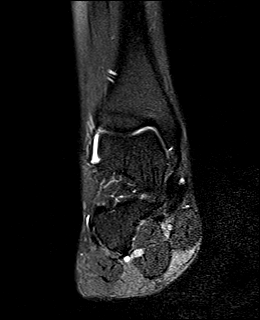
[im 23/39]
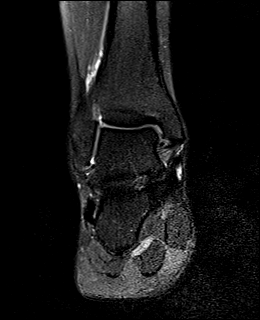
[im 27/39]
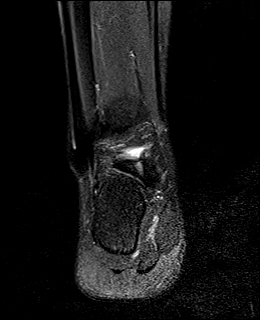
[im 31/39]
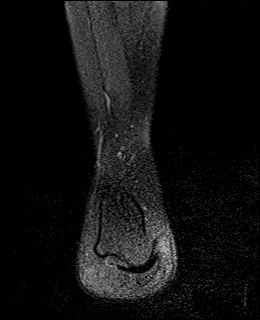
[im 35/39]
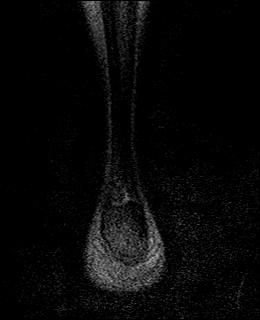
[im 39/39]
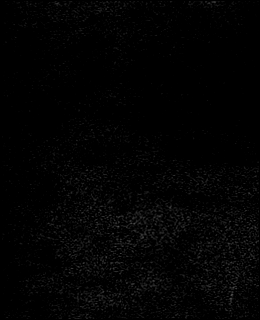

[40 of 40 positions shown; findings below may reference images not displayed]

FINDINGS: TENDONS

Peroneal: Peroneal longus tendon intact. Peroneal brevis intact.

Posteromedial: Posterior tibial tendon intact. Flexor hallucis
longus tendon intact. Flexor digitorum longus tendon intact.

Anterior: Tibialis anterior tendon intact. Extensor hallucis longus
tendon intact Extensor digitorum longus tendon intact.

Achilles:  Intact.

Plantar Fascia: Intact.

LIGAMENTS

Lateral: Anterior talofibular ligament intact. Calcaneofibular
ligament intact. Posterior talofibular ligament intact. Anterior and
posterior tibiofibular ligaments intact.

Medial: Deltoid ligament intact. Spring ligament intact.

CARTILAGE

Ankle Joint: Moderate joint effusion. Normal ankle mortise. No
chondral defect.

Subtalar Joints/Sinus Tarsi: Normal subtalar joints. No subtalar
joint effusion. Normal sinus tarsi.

Bones: No marrow signal abnormality.  No fracture or dislocation.

Soft Tissue: No fluid collection or hematoma. Muscles are normal
without edema or atrophy. Tarsal tunnel is normal.
IMPRESSION: 1.  Moderate ankle joint effusion.

2.  No evidence of fracture or osteonecrosis.

3. No evidence of ligamentous or tendon injury. No evidence of
tenosynovitis.

## 2022-06-09 ENCOUNTER — Ambulatory Visit: Payer: 59 | Admitting: Podiatry

## 2022-06-09 DIAGNOSIS — Q666 Other congenital valgus deformities of feet: Secondary | ICD-10-CM | POA: Diagnosis not present

## 2022-06-09 DIAGNOSIS — M7671 Peroneal tendinitis, right leg: Secondary | ICD-10-CM

## 2022-08-04 ENCOUNTER — Ambulatory Visit: Payer: 59 | Admitting: Podiatry

## 2022-08-28 ENCOUNTER — Telehealth: Payer: Self-pay | Admitting: Podiatry

## 2022-08-28 NOTE — Telephone Encounter (Signed)
Pt cxled appt for 9.29 due to financial restraint but is continuing to do the physical therapy exercises at home and is feeling better. She will call to reschedule when she is able to.

## 2022-09-15 ENCOUNTER — Ambulatory Visit: Payer: 59 | Admitting: Podiatry

## 2022-10-17 ENCOUNTER — Ambulatory Visit: Payer: Self-pay

## 2022-10-17 NOTE — Patient Outreach (Signed)
  Care Coordination   10/17/2022 Name: Janice [name] MRN: [id number] DOB: 1991/10/18   Care Coordination Outreach Attempts:  An unsuccessful telephone outreach was attempted today to offer the patient information about available care coordination services as a benefit of their health plan.   Follow Up Plan:  Additional outreach attempts will be made to offer the patient care coordination information and services.   Encounter Outcome:  No Answer  Care Coordination Interventions Activated:  No   Care Coordination Interventions:  No, not indicated    Daneen Schick, BSW, CDP Social Worker, Certified Dementia Practitioner [facility] Care Management  Care Coordination [phone]

## 2022-10-19 ENCOUNTER — Ambulatory Visit: Payer: Self-pay

## 2022-10-19 NOTE — Patient Outreach (Signed)
  Care Coordination   Initial Visit Note   10/19/2022 Name: Janice [name] MRN: [id number] DOB: 07-09-1991  Janice [name] is a 31 y.o. year old female who sees Aycock, Edmonia Lynch, MD for primary care. I spoke with  Janice [name] by phone today.  What matters to the patients health and wellness today?  No needs identified   Goals Addressed             This Visit's Progress    Care Coordination Activities       Care Coordination Interventions: Education provided on the role of the care coordination team - patient declined to participate in today's call Instructed the patient to contact her primary care provider as needed        SDOH assessments and interventions completed:  No     Care Coordination Interventions Activated:  Yes  Care Coordination Interventions:  Yes, provided   Follow up plan: No further intervention required.   Encounter Outcome:  Pt. Visit Completed   Daneen Schick, BSW, CDP Social Worker, Certified Dementia Practitioner [place] Management  Care Coordination [phone]

## 2022-10-19 NOTE — Patient Outreach (Signed)
  Care Coordination   10/19/2022 Name: Janice [name] MRN: [id number] DOB: Jan 04, 1991   Care Coordination Outreach Attempts:  A second unsuccessful outreach was attempted today to offer the patient with information about available care coordination services as a benefit of their health plan.     Follow Up Plan:  Additional outreach attempts will be made to offer the patient care coordination information and services.   Encounter Outcome:  No Answer  Care Coordination Interventions Activated:  No   Care Coordination Interventions:  No, not indicated    Daneen Schick, BSW, CDP Social Worker, Certified Dementia Practitioner [facility] Care Management  Care Coordination [phone]

## 2022-10-19 NOTE — Patient Instructions (Signed)
Visit Information  Thank you for taking time to visit with me today. Please don't hesitate to contact me if I can be of assistance to you.   Following are the goals we discussed today:   Goals Addressed             This Visit's Progress    Care Coordination Activities       Care Coordination Interventions: Education provided on the role of the care coordination team - patient declined to participate in today's call Instructed the patient to contact her primary care provider as needed         If you are experiencing a Mental Health or [place] or need someone to talk to, please call 1-800-273-TALK (toll free, 24 hour hotline)  Patient verbalizes understanding of instructions and care plan provided today and agrees to view in [place]. Active MyChart status and patient understanding of how to access instructions and care plan via MyChart confirmed with patient.     No further follow up required: Please contact your primary care provider as needed  Daneen Schick, Arita Miss, CDP Social Worker, Certified Dementia Practitioner [place] Coordination [phone]
